# Patient Record
Sex: Female | Born: 2000 | Hispanic: Yes | Marital: Single | Smoking: Current some day smoker
Health system: Southern US, Community
[De-identification: ages and names within clinical notes are randomized; demographics above are authoritative.]

## PROBLEM LIST (undated history)

## (undated) DIAGNOSIS — L680 Hirsutism: Secondary | ICD-10-CM

## (undated) DIAGNOSIS — J302 Other seasonal allergic rhinitis: Secondary | ICD-10-CM

## (undated) DIAGNOSIS — N946 Dysmenorrhea, unspecified: Secondary | ICD-10-CM

## (undated) HISTORY — DX: Hirsutism: L68.0

---
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
## 2019-01-06 ENCOUNTER — Telehealth: Payer: Self-pay | Admitting: Pediatrics

## 2019-01-06 NOTE — Telephone Encounter (Signed)

## 2019-01-07 ENCOUNTER — Ambulatory Visit (INDEPENDENT_AMBULATORY_CARE_PROVIDER_SITE_OTHER): Payer: Medicaid Other | Admitting: Licensed Clinical Social Worker

## 2019-01-07 ENCOUNTER — Encounter: Payer: Self-pay | Admitting: Family

## 2019-01-07 ENCOUNTER — Other Ambulatory Visit: Payer: Self-pay

## 2019-01-07 ENCOUNTER — Ambulatory Visit (INDEPENDENT_AMBULATORY_CARE_PROVIDER_SITE_OTHER): Payer: Medicaid Other | Admitting: Family

## 2019-01-07 VITALS — BP 121/75 | HR 85 | Ht 62.4 in | Wt 152.6 lb

## 2019-01-07 DIAGNOSIS — F4323 Adjustment disorder with mixed anxiety and depressed mood: Secondary | ICD-10-CM | POA: Diagnosis not present

## 2019-01-07 DIAGNOSIS — N946 Dysmenorrhea, unspecified: Secondary | ICD-10-CM | POA: Diagnosis not present

## 2019-01-07 DIAGNOSIS — Z789 Other specified health status: Secondary | ICD-10-CM | POA: Diagnosis not present

## 2019-01-07 DIAGNOSIS — Z30012 Encounter for prescription of emergency contraception: Secondary | ICD-10-CM

## 2019-01-07 DIAGNOSIS — N920 Excessive and frequent menstruation with regular cycle: Secondary | ICD-10-CM | POA: Diagnosis not present

## 2019-01-07 DIAGNOSIS — Z3046 Encounter for surveillance of implantable subdermal contraceptive: Secondary | ICD-10-CM | POA: Diagnosis not present

## 2019-01-07 MED ORDER — NORTREL 0.5/35 (28) 0.5-35 MG-MCG PO TABS: 1.0000 | ORAL_TABLET | Freq: Every day | ORAL | 11 refills | Status: DC

## 2019-01-07 MED ORDER — FLUOXETINE HCL 40 MG PO CAPS: 40.0000 mg | ORAL_CAPSULE | Freq: Every day | ORAL | 0 refills | Status: DC

## 2019-01-07 MED ORDER — ULIPRISTAL ACETATE 30 MG PO TABS: 1.0000 | ORAL_TABLET | Freq: Once | ORAL | 0 refills | Status: AC

## 2019-01-07 NOTE — BH Specialist Note (Signed)
Integrated Behavioral Health via Telemedicine Video Visit  01/07/2019 Vernella Jerilynn Mages Caesar [name] [id number]  Number of [place] visits: 7th ([facility] on 5/13) Session Start time: 2:30P  Session End time: 3:01 PM Total time: 31 minutes  Referring Provider: Dr. Rae Lips Type of Visit: Video Patient/Family location: Home [facility] Provider location: Remote home office All persons participating in visit: Patient and [facility]  Confirmed patient's address: Yes  Confirmed patient's phone number: Yes  Any changes to demographics: No   Confirmed patient's insurance: Yes  Any changes to patient's insurance: No   Discussed confidentiality: Yes   I connected with Nicole [name] and/or Nicole [name]'s patient by a video enabled telemedicine application and verified that I am speaking with the correct person using two identifiers.     I discussed the limitations of evaluation and management by telemedicine and the availability of in person appointments.  I discussed that the purpose of this visit is to provide behavioral health care while limiting exposure to the novel coronavirus.   Discussed there is a possibility of technology failure and discussed alternative modes of communication if that failure occurs.  I discussed that engaging in this video visit, they consent to the provision of behavioral healthcare and the services will be billed under their insurance.  Patient and/or legal guardian expressed understanding and consented to video visit: Yes   PRESENTING CONCERNS: Patient and/or family reports the following symptoms/concerns: depression/anxiety Duration of problem: Years; Severity of problem: moderate  STRENGTHS (Protective Factors/Coping Skills): Open  GOALS ADDRESSED: Patient will: 1.  Reduce symptoms of: anxiety and depression  2.  Increase knowledge and/or ability of: coping skills and healthy habits  3.  Demonstrate ability to: Increase healthy  adjustment to current life circumstances  INTERVENTIONS: Interventions utilized:  Solution-Focused Strategies, Mindfulness or Psychologist, educational, Behavioral Activation, Supportive Counseling and Psychoeducation and/or Health Education Standardized Assessments completed: Not Needed  ASSESSMENT: Patient currently experiencing moved in to her new apartment last weekend.   Patient may benefit from making a schedule, is looking at registration currently.   Feeling tired, went on a trip this weekend to the mountains.   "I want to get my life more together." Sleep, organization, routine.    PLAN: 1. Follow up with behavioral health clinician on : PRN 2. Behavioral recommendations: See above 3. Referral(s): [place] (In Clinic)  I discussed the assessment and treatment plan with the patient and/or parent/guardian. They were provided an opportunity to ask questions and all were answered. They agreed with the plan and demonstrated an understanding of the instructions.   They were advised to call back or seek an in-person evaluation if the symptoms worsen or if the condition fails to improve as anticipated.  Nicole [name]

## 2019-01-08 ENCOUNTER — Ambulatory Visit (INDEPENDENT_AMBULATORY_CARE_PROVIDER_SITE_OTHER): Payer: Medicaid Other | Admitting: Family

## 2019-01-08 VITALS — BP 116/79 | HR 70

## 2019-01-08 DIAGNOSIS — Z30012 Encounter for prescription of emergency contraception: Secondary | ICD-10-CM | POA: Diagnosis not present

## 2019-01-08 MED ORDER — LEVONORGESTREL 1.5 MG PO TABS
1.5000 mg | ORAL_TABLET | Freq: Once | ORAL | Status: AC
  Administered 2019-01-08: 1.5 mg via ORAL

## 2019-01-09 ENCOUNTER — Encounter: Payer: Self-pay | Admitting: Family

## 2019-01-09 NOTE — Progress Notes (Addendum)
History was provided by the patient.  Nicole [name] is a 18 y.o. female who is here for nexplanon removal.   PCP confirmed? Yes.    Kalman JewelsMcQueen, Shannon, MD  HPI:   -desires nexplanon removal, had it since 04/2018. -[place] this [place]  -no bleeding or cycle on implant -not interested in pg, has BF, one female partner  -she doesn't want an implant again, nor an IUD -she is interested in OCPs, no contraindication for estrogen - specifically no migraine with aura, no known liver disease, no cancers, no DVT/PE.   Review of Systems  Constitutional: Negative for chills, fever and malaise/fatigue.  HENT: Negative for sore throat.   Respiratory: Negative for cough and shortness of breath.   Cardiovascular: Negative for chest pain and palpitations.  Gastrointestinal: Negative for abdominal pain, heartburn and nausea.  Genitourinary: Negative for dysuria and frequency.  Musculoskeletal: Negative for myalgias.  Skin: Negative for rash.  Neurological: Negative for dizziness and headaches.  Psychiatric/Behavioral: Negative for depression.     Patient Active Problem List   Diagnosis Date Noted  . Encounter for surveillance of Nexplanon subdermal contraceptive 06/18/2018  . Dysphonia 12/10/2017  . Globus sensation 12/10/2017  . Adjustment disorder with mixed anxiety and depressed mood 05/15/2017  . Voice strain 02/19/2017  . Acne vulgaris 09/07/2014  . Allergic rhinitis 01/31/2013    Current Outpatient Medications on File Prior to Visit  Medication Sig Dispense Refill  . clindamycin-benzoyl peroxide (BENZACLIN) gel Apply topically 2 (two) times daily. 25 g 3  . ibuprofen (ADVIL) 800 MG tablet Take 1 tablet (800 mg total) by mouth every 8 (eight) hours as needed. 30 tablet 0  . montelukast (SINGULAIR) 10 MG tablet TAKE 1 TABLET BY MOUTH EVERYDAY AT BEDTIME 90 tablet 1  . Olopatadine HCl (PAZEO) 0.7 % SOLN Place 1 drop into both eyes daily. 2.5 mL 3   No current  facility-administered medications on file prior to visit.     No Known Allergies  Physical Exam:    Vitals:   01/07/19 0856  BP: 121/75  Pulse: 85  Weight: 152 lb 9.6 oz (69.2 kg)  Height: 5' 2.4" (1.585 m)    Blood pressure percentiles are not available for patients who are 18 years or older. No LMP recorded.  Physical Exam Vitals signs reviewed.  Constitutional:      Appearance: She is not ill-appearing.  HENT:     Head: Normocephalic.  Eyes:     Extraocular Movements: Extraocular movements intact.     Pupils: Pupils are equal, round, and reactive to light.     Comments: Wearing corrective lenses   Neck:     Musculoskeletal: Normal range of motion.  Cardiovascular:     Rate and Rhythm: Normal rate and regular rhythm.     Heart sounds: No murmur.  Musculoskeletal: Normal range of motion.  Lymphadenopathy:     Cervical: No cervical adenopathy.  Skin:    General: Skin is warm and dry.     Findings: No rash.     Comments: Implant palpable in LUE -correct position prior to removal  Neurological:     General: No focal deficit present.     Mental Status: She is alert.  Psychiatric:        Attention and Perception: Attention normal.        Mood and Affect: Mood normal.     Assessment/Plan: 1. Menorrhagia with regular cycle  -we reviewed tier 1 and tier 2 birth  control options, including iud, implant, depo, pill, patch, ring  -she elects to return to OCPs -will follow up in 8 weeks via video to discuss use of OCPs -reviewed EC and condom use - norethindrone-ethinyl estradiol (NORTREL 0.5/35, 28,) 0.5-35 MG-MCG tablet; Take 1 tablet by mouth daily.  Dispense: 28 tablet; Refill: 11  2. Dysmenorrhea -as above; should see control of cramping with OCP use -reviewed how to use OCP pack  - norethindrone-ethinyl estradiol (NORTREL 0.5/35, 28,) 0.5-35 MG-MCG tablet; Take 1 tablet by mouth daily.  Dispense: 28 tablet; Refill: 11  3. Encounter for Nexplanon removal Risks &  benefits of Nexplanon removal discussed. Consent form signed.  The patient denies any allergies to anesthetics or antiseptics.  Procedure: Pt was placed in supine position. left arm was flexed at the elbow and externally rotated so that her wrist was parallel to her ear, The device was palpated and marked. The site was cleaned with Betadine. The area surrounding the device was covered with a sterile drape. 1% lidocaine was injected just under the device. A scalpel was used to create a small incision. The device was pushed towards the incision. Fibrous tissue surrounding the device was gradually removed from the device. The device was removed and measured to ensure all 4 cm of device was removed. Steri-strips were used to close the incision. Pressure dressing was applied to the patient.  The patient was instructed to removed the pressure dressing in 24 hrs.  The patient was advised to move slowly from a supine to an upright position  The patient denied any concerns or complaints  The patient was instructed to schedule a follow-up appt in 1 month. The patient will be called in 1 week to address any concerns.  4. Adjustment disorder with mixed anxiety and depressed mood -continue fluoxetine 40 mg, refill given  -she is safe to herself, continue therapy  - FLUoxetine (PROZAC) 40 MG capsule; Take 1 capsule (40 mg total) by mouth daily.  Dispense: 90 capsule; Refill: 0  5. Emergency contraception -reviewed that if needed as back-up consider having on-hand - ulipristal acetate (ELLA) 30 MG tablet; Take 1 tablet (30 mg total) by mouth once for 1 dose.  Dispense: 1 tablet; Refill: 0

## 2019-01-09 NOTE — Progress Notes (Signed)
History was provided by the patient.  Nicole [name] is a 18 y.o. female who is here for emergency contraception.   PCP confirmed? Yes.    Rae Lips, MD  HPI:   -patient had nexplanon removed yesterday  -had concern and anxiety over possible pregnancy since her last unprotected sex was Sunday  -insurance does not cover the prescription sent.   -she is not feeling well, nauseous, dizzy left work early; wearing mask    Patient Active Problem List   Diagnosis Date Noted  . Encounter for surveillance of Nexplanon subdermal contraceptive 06/18/2018  . Dysphonia 12/10/2017  . Globus sensation 12/10/2017  . Adjustment disorder with mixed anxiety and depressed mood 05/15/2017  . Voice strain 02/19/2017  . Acne vulgaris 09/07/2014  . Allergic rhinitis 01/31/2013    Current Outpatient Medications on File Prior to Visit  Medication Sig Dispense Refill  . clindamycin-benzoyl peroxide (BENZACLIN) gel Apply topically 2 (two) times daily. 25 g 3  . FLUoxetine (PROZAC) 40 MG capsule Take 1 capsule (40 mg total) by mouth daily. 90 capsule 0  . ibuprofen (ADVIL) 800 MG tablet Take 1 tablet (800 mg total) by mouth every 8 (eight) hours as needed. 30 tablet 0  . montelukast (SINGULAIR) 10 MG tablet TAKE 1 TABLET BY MOUTH EVERYDAY AT BEDTIME 90 tablet 1  . norethindrone-ethinyl estradiol (NORTREL 0.5/35, 28,) 0.5-35 MG-MCG tablet Take 1 tablet by mouth daily. 28 tablet 11  . Olopatadine HCl (PAZEO) 0.7 % SOLN Place 1 drop into both eyes daily. 2.5 mL 3   No current facility-administered medications on file prior to visit.     No Known Allergies     Vitals:   01/08/19 0956  BP: 116/79  Pulse: 70    Assessment/Plan:  1. Encounter for prescription of emergency contraception  - levonorgestrel (PLAN B 1-STEP) tablet 1.5 mg  EC given to patient by Otho Ket, [place]  Patient was advised to wait 5 days to begin OCP pack  Return precautions given

## 2019-01-11 ENCOUNTER — Encounter: Payer: Self-pay | Admitting: Family

## 2019-01-31 ENCOUNTER — Ambulatory Visit: Payer: Medicaid Other | Admitting: Licensed Clinical Social Worker

## 2019-01-31 DIAGNOSIS — F4323 Adjustment disorder with mixed anxiety and depressed mood: Secondary | ICD-10-CM

## 2019-01-31 NOTE — BH Specialist Note (Signed)
Integrated Behavioral Health via Telemedicine Video Visit  01/31/2019 Joreen Jerilynn Mages Caesar Chestnut [id number]  Number of [place] visits: 8 Session Start time: 11:15A  Session End time: 11:25A Total time: 10 minutes   No charge for this visit due to brief length of time.   Referring Provider: Dr. Rae Lips Type of Visit: Video Patient/Family location: Home [facility] Provider location: Remote home office All persons participating in visit: Patient and [facility]  Confirmed patient's address: Yes  Confirmed patient's phone number: Yes  Any changes to demographics: No   Confirmed patient's insurance: Yes  Any changes to patient's insurance: No   Discussed confidentiality: Yes   I connected with Ivannia M Caesar Chestnut and/or Nautica Laury Deep Carrera's patient by a video enabled telemedicine application and verified that I am speaking with the correct person using two identifiers.     I discussed the limitations of evaluation and management by telemedicine and the availability of in person appointments.  I discussed that the purpose of this visit is to provide behavioral health care while limiting exposure to the novel coronavirus.   Discussed there is a possibility of technology failure and discussed alternative modes of communication if that failure occurs.  I discussed that engaging in this video visit, they consent to the provision of behavioral healthcare and the services will be billed under their insurance.  Patient and/or legal guardian expressed understanding and consented to video visit: Yes   PRESENTING CONCERNS: Patient and/or family reports the following symptoms/concerns: stress Duration of problem: Ongoing; Severity of problem: mild  STRENGTHS (Protective Factors/Coping Skills): Smart, open  GOALS ADDRESSED: Patient will: 1.  Reduce symptoms of: stress  2.  Increase knowledge and/or ability of: healthy habits  3.  Demonstrate ability to: Increase healthy  adjustment to current life circumstances and Increase adequate support systems for patient/family  INTERVENTIONS: Interventions utilized:  Solution-Focused Strategies and Supportive Counseling Standardized Assessments completed: Not Needed  ASSESSMENT: Patient currently experiencing stress related to school and life circumstances.   Patient may benefit from OPT or in-house at [facility] therapy for support.  PLAN: 1. Follow up with behavioral health clinician on : PRN 2. Behavioral recommendations: Patient asked for an email with resources so she can reach out PRN 3. Referral(s): [facility] (LME/Outside Clinic)  I discussed the assessment and treatment plan with the patient and/or parent/guardian. They were provided an opportunity to ask questions and all were answered. They agreed with the plan and demonstrated an understanding of the instructions.   They were advised to call back or seek an in-person evaluation if the symptoms worsen or if the condition fails to improve as anticipated.  Marinda Elk

## 2019-03-12 ENCOUNTER — Ambulatory Visit: Payer: Medicaid Other | Admitting: Family

## 2019-03-21 DIAGNOSIS — F419 Anxiety disorder, unspecified: Secondary | ICD-10-CM | POA: Diagnosis not present

## 2019-04-04 DIAGNOSIS — F419 Anxiety disorder, unspecified: Secondary | ICD-10-CM | POA: Diagnosis not present

## 2019-04-05 ENCOUNTER — Other Ambulatory Visit: Payer: Self-pay | Admitting: Pediatrics

## 2019-04-05 DIAGNOSIS — N946 Dysmenorrhea, unspecified: Secondary | ICD-10-CM

## 2019-04-11 ENCOUNTER — Other Ambulatory Visit: Payer: Self-pay

## 2019-04-11 DIAGNOSIS — Z20822 Contact with and (suspected) exposure to covid-19: Secondary | ICD-10-CM

## 2019-04-11 DIAGNOSIS — Z20828 Contact with and (suspected) exposure to other viral communicable diseases: Secondary | ICD-10-CM | POA: Diagnosis not present

## 2019-04-13 LAB — NOVEL CORONAVIRUS, NAA: SARS-CoV-2, NAA: NOT DETECTED

## 2019-04-22 ENCOUNTER — Other Ambulatory Visit: Payer: Self-pay | Admitting: Family

## 2019-04-22 DIAGNOSIS — F419 Anxiety disorder, unspecified: Secondary | ICD-10-CM | POA: Diagnosis not present

## 2019-04-22 DIAGNOSIS — F4323 Adjustment disorder with mixed anxiety and depressed mood: Secondary | ICD-10-CM

## 2019-04-25 DIAGNOSIS — F419 Anxiety disorder, unspecified: Secondary | ICD-10-CM | POA: Diagnosis not present

## 2019-05-02 DIAGNOSIS — F419 Anxiety disorder, unspecified: Secondary | ICD-10-CM | POA: Diagnosis not present

## 2019-05-07 ENCOUNTER — Other Ambulatory Visit: Payer: Self-pay

## 2019-05-07 DIAGNOSIS — Z20828 Contact with and (suspected) exposure to other viral communicable diseases: Secondary | ICD-10-CM | POA: Diagnosis not present

## 2019-05-07 DIAGNOSIS — Z20822 Contact with and (suspected) exposure to covid-19: Secondary | ICD-10-CM

## 2019-05-08 LAB — NOVEL CORONAVIRUS, NAA: SARS-CoV-2, NAA: NOT DETECTED

## 2019-05-27 DIAGNOSIS — F419 Anxiety disorder, unspecified: Secondary | ICD-10-CM | POA: Diagnosis not present

## 2019-06-02 ENCOUNTER — Other Ambulatory Visit: Payer: Self-pay

## 2019-06-02 ENCOUNTER — Ambulatory Visit: Payer: Medicaid Other | Attending: Internal Medicine

## 2019-06-02 DIAGNOSIS — Z20822 Contact with and (suspected) exposure to covid-19: Secondary | ICD-10-CM

## 2019-06-02 DIAGNOSIS — Z20828 Contact with and (suspected) exposure to other viral communicable diseases: Secondary | ICD-10-CM | POA: Diagnosis not present

## 2019-06-03 LAB — NOVEL CORONAVIRUS, NAA: SARS-CoV-2, NAA: NOT DETECTED

## 2019-06-17 DIAGNOSIS — F419 Anxiety disorder, unspecified: Secondary | ICD-10-CM | POA: Diagnosis not present

## 2019-06-26 DIAGNOSIS — F419 Anxiety disorder, unspecified: Secondary | ICD-10-CM | POA: Diagnosis not present

## 2019-07-01 ENCOUNTER — Telehealth: Payer: Medicaid Other

## 2019-07-01 ENCOUNTER — Other Ambulatory Visit: Payer: Self-pay

## 2019-07-01 ENCOUNTER — Encounter: Payer: Self-pay | Admitting: Family

## 2019-07-01 ENCOUNTER — Ambulatory Visit (INDEPENDENT_AMBULATORY_CARE_PROVIDER_SITE_OTHER): Payer: Medicaid Other | Admitting: *Deleted

## 2019-07-01 ENCOUNTER — Other Ambulatory Visit: Payer: Self-pay | Admitting: Pediatrics

## 2019-07-01 DIAGNOSIS — N926 Irregular menstruation, unspecified: Secondary | ICD-10-CM

## 2019-07-01 LAB — HCG, QUANTITATIVE, PREGNANCY: HCG, Total, QN: <3 m[IU]/mL

## 2019-07-01 NOTE — Progress Notes (Signed)
Patient came in for labs beta hcg. Labs ordered by Alfonso Ramus. Successful collection.

## 2019-07-03 ENCOUNTER — Encounter: Payer: Self-pay | Admitting: Pediatrics

## 2019-07-03 ENCOUNTER — Telehealth: Payer: Medicaid Other | Admitting: Pediatrics

## 2019-07-03 ENCOUNTER — Other Ambulatory Visit: Payer: Self-pay

## 2019-07-03 NOTE — Progress Notes (Signed)
No show. Opened in error

## 2019-07-04 ENCOUNTER — Other Ambulatory Visit: Payer: Self-pay | Admitting: Family

## 2019-07-04 DIAGNOSIS — N946 Dysmenorrhea, unspecified: Secondary | ICD-10-CM

## 2019-07-05 DIAGNOSIS — Z20828 Contact with and (suspected) exposure to other viral communicable diseases: Secondary | ICD-10-CM | POA: Diagnosis not present

## 2019-07-07 ENCOUNTER — Other Ambulatory Visit: Payer: Self-pay | Admitting: Allergy and Immunology

## 2019-07-08 ENCOUNTER — Other Ambulatory Visit: Payer: Medicaid Other

## 2019-07-24 DIAGNOSIS — F419 Anxiety disorder, unspecified: Secondary | ICD-10-CM | POA: Diagnosis not present

## 2019-08-07 DIAGNOSIS — F419 Anxiety disorder, unspecified: Secondary | ICD-10-CM | POA: Diagnosis not present

## 2019-08-12 DIAGNOSIS — F419 Anxiety disorder, unspecified: Secondary | ICD-10-CM | POA: Diagnosis not present

## 2019-08-14 DIAGNOSIS — H52223 Regular astigmatism, bilateral: Secondary | ICD-10-CM | POA: Diagnosis not present

## 2019-08-14 DIAGNOSIS — H538 Other visual disturbances: Secondary | ICD-10-CM | POA: Diagnosis not present

## 2019-08-14 DIAGNOSIS — H5213 Myopia, bilateral: Secondary | ICD-10-CM | POA: Diagnosis not present

## 2019-08-18 ENCOUNTER — Other Ambulatory Visit: Payer: Self-pay | Admitting: Family

## 2019-08-18 DIAGNOSIS — H5213 Myopia, bilateral: Secondary | ICD-10-CM | POA: Diagnosis not present

## 2019-08-18 DIAGNOSIS — F4323 Adjustment disorder with mixed anxiety and depressed mood: Secondary | ICD-10-CM

## 2019-08-21 DIAGNOSIS — F419 Anxiety disorder, unspecified: Secondary | ICD-10-CM | POA: Diagnosis not present

## 2019-08-25 ENCOUNTER — Telehealth: Payer: Self-pay | Admitting: Pediatrics

## 2019-08-25 NOTE — Telephone Encounter (Signed)
Pre-screening for onsite visit  1. Who is bringing the patient to the visit? Patient  Informed only one adult can bring patient to the visit to limit possible exposure to COVID19 and facemasks must be worn while in the building by the patient (ages 2 and older) and adult.  2. Has the person bringing the patient or the patient been around anyone with suspected or confirmed COVID-19 in the last 14 days? No   3. Has the person bringing the patient or the patient been around anyone who has been tested for COVID-19 in the last 14 days? No  4. Has the person bringing the patient or the patient had any No   Fever (temp 100 F or higher) Breathing problems Cough Sore throat Body aches Chills Vomiting Diarrhea Loss of taste or smell   If all answers are negative, advise patient to call our office prior to your appointment if you or the patient develop any of the symptoms listed above.   If any answers are yes, cancel in-office visit and schedule the patient for a same day telehealth visit with a provider to discuss the next steps.

## 2019-08-26 ENCOUNTER — Other Ambulatory Visit: Payer: Self-pay

## 2019-08-26 ENCOUNTER — Ambulatory Visit (INDEPENDENT_AMBULATORY_CARE_PROVIDER_SITE_OTHER): Payer: Medicaid Other | Admitting: Pediatrics

## 2019-08-26 VITALS — BP 135/80 | HR 85 | Temp 97.3°F | Ht 62.6 in | Wt 170.6 lb

## 2019-08-26 DIAGNOSIS — N898 Other specified noninflammatory disorders of vagina: Secondary | ICD-10-CM

## 2019-08-26 DIAGNOSIS — Z1389 Encounter for screening for other disorder: Secondary | ICD-10-CM | POA: Diagnosis not present

## 2019-08-26 DIAGNOSIS — Z720 Tobacco use: Secondary | ICD-10-CM | POA: Diagnosis not present

## 2019-08-26 DIAGNOSIS — F4323 Adjustment disorder with mixed anxiety and depressed mood: Secondary | ICD-10-CM | POA: Diagnosis not present

## 2019-08-26 LAB — POCT URINE PREGNANCY: Preg Test, Ur: NEGATIVE

## 2019-08-26 LAB — POCT URINALYSIS DIPSTICK
Bilirubin, UA: NEGATIVE
Glucose, UA: NEGATIVE
Nitrite, UA: NEGATIVE
Protein, UA: NEGATIVE
Spec Grav, UA: 1.02 (ref 1.010–1.025)
Urobilinogen, UA: NEGATIVE E.U./dL — AB
pH, UA: 5 (ref 5.0–8.0)

## 2019-08-26 MED ORDER — FLUOXETINE HCL 20 MG PO CAPS: 20.0000 mg | ORAL_CAPSULE | Freq: Every day | ORAL | 0 refills | Status: DC

## 2019-08-26 NOTE — Progress Notes (Signed)
This note is not being shared with the patient for the following reason: To respect privacy (The patient or proxy has requested that the information not be shared).  THIS RECORD MAY CONTAIN CONFIDENTIAL INFORMATION THAT SHOULD NOT BE RELEASED WITHOUT REVIEW OF THE SERVICE PROVIDER.  Adolescent Medicine Consultation Follow-Up Visit Nicole [name]  is a 19 y.o. female referred by Kalman Jewels, MD here today for follow-up regarding adjustment disorder with mixed anxiety and depressed mood and vaginal discharge.    Plan at last adolescent specialty clinic visit included: 01/07/19: -nexplanon removed -started nortrel 0.5/35 01/08/2019: - plan B 07/01/19: Completed bHCG (negative) after missed menses 08/25/19: Sent message yesterday about concern re anxiety and dysuria  Pertinent Labs? Yes, UA with small leukocytes Growth Chart Viewed? yes   History was provided by the patient.  Interpreter? no  Chief complaint: dysuria and anxiety  HPI:   PCP Confirmed?  yes  My Chart Activated?   yes  Patient's personal or confidential phone number: [phone]  Needs a referral to gynecology- reviewed that GYN needs can be addressed in our clinic up to age 52.  Had a yeast infection last week- excessive discharge, pruritis, felt inflammed. Got OTC miconazole, which helped. White discharge, thick but not super thick. Has had yeast infections at least once a year.   Has burning when urine runs over the vulva and is concerned about an abrasion. Sometimes has minimal itching. First noted abrasion Wed or Thursday of last week. Thinks that abrasions may be related to sexual activity and certain positions. She does not use lubrication. She denies any true dysuria. Has some increase in urinary frequency, but no urgency.   Last sexually active early last week. Has only had one partner, is not very worried about STIs.   Would like to start fluoxetine again. Was on 40mg  as of last year. Would  often forget and then stopped taking it since she was not taking it consistent. Reports that mood has been "fine" but she is feeling more anxious regarding relationship or her life.  Has been seeing a therapist weekly since the fall. Attributes some anxiety to job search after quitting her job at , where she was miserable (did not like the people, masks not enforced).  Missed January period. Periods then returned to normal. She has been doing a reminder on her phone to remember to take OCPs. Attributed irregularity to not taking OCPs regularly. Does not use condoms and does not want any today.   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 Recently quit job and is looking for another job that fits her schedule.  No LMP recorded. No Known Allergies Current Outpatient Medications on File Prior to Visit  Medication Sig Dispense Refill  . clindamycin-benzoyl peroxide (BENZACLIN) gel Apply topically 2 (two) times daily. 25 g 3  . FLUoxetine (PROZAC) 40 MG capsule TAKE 1 CAPSULE BY MOUTH EVERY DAY 90 capsule 0  . ibuprofen (ADVIL) 800 MG tablet TAKE 1 TABLET BY MOUTH EVERY 8 HOURS AS NEEDED 30 tablet 0  . montelukast (SINGULAIR) 10 MG tablet TAKE 1 TABLET BY MOUTH EVERYDAY AT BEDTIME 30 tablet 0  . norethindrone-ethinyl estradiol (NORTREL 0.5/35, 28,) 0.5-35 MG-MCG tablet Take 1 tablet by mouth daily. 28 tablet 11  . Olopatadine HCl (PAZEO) 0.7 % SOLN Place 1 drop into both eyes daily. 2.5 mL 3  . [DISCONTINUED] ibuprofen (ADVIL) 800 MG tablet TAKE 1 TABLET BY MOUTH EVERY 8 HOURS AS NEEDED 30 tablet  0   No current facility-administered medications on file prior to visit.    Patient Active Problem List   Diagnosis Date Noted  . Encounter for surveillance of Nexplanon subdermal contraceptive 06/18/2018  . Dysphonia 12/10/2017  . Globus sensation 12/10/2017  . Adjustment disorder with mixed anxiety and depressed mood 05/15/2017  . Voice strain 02/19/2017  . Acne vulgaris  09/07/2014  . Allergic rhinitis 01/31/2013     Activities:  Special interests/hobbies/sports: enjoys going out to eat, hanging out with friends  Lifestyle habits that can impact QOL: Sleep: fine Eating habits/patterns:  Water intake: decent Body Movement: not discussed today  Confidentiality was discussed with the patient and if applicable, with caregiver as well.  Changes at home or school since last visit:  yes, quit job at [employer]  Tobacco?  yes, vaping- daily disposables; says that she knows she needs to stop, believes that she will be able to quit when she wants to and decides to try, not interested in nicotine replacement therapy at this time Drugs/ETOH?  no Partner preference?  female  Sexually Active?  yes; birth control pills; condoms offered but patient declined  Suicidal or homicidal thoughts?   no Self injurious behaviors?  no   The following portions of the patient's history were reviewed and updated as appropriate: allergies, current medications, past family history, past medical history, past social history, past surgical history and problem list.  Physical Exam:  Vitals:   08/26/19 1031  BP: 135/80  Pulse: 85  Temp: (!) 97.3 F (36.3 C)  Weight: 170 lb 9.6 oz (77.4 kg)  Height: 5' 2.6" (1.59 m)   BP 135/80   Pulse 85   Temp (!) 97.3 F (36.3 C)   Ht 5' 2.6" (1.59 m)   Wt 170 lb 9.6 oz (77.4 kg)   BMI 30.61 kg/m  Body mass index: body mass index is 30.61 kg/m. Blood pressure percentiles are not available for patients who are 18 years or older.  Physical Exam Exam conducted with a chaperone present.  Constitutional:      General: She is not in acute distress.    Appearance: Normal appearance. She is not toxic-appearing.  HENT:     Head: Normocephalic and atraumatic.     Right Ear: External ear normal.     Left Ear: External ear normal.     Nose: Nose normal. No congestion.     Mouth/Throat:     Mouth: Mucous membranes are moist.      Pharynx: Oropharynx is clear.  Eyes:     Extraocular Movements: Extraocular movements intact.     Conjunctiva/sclera: Conjunctivae normal.     Comments: Wears corrective lenses  Cardiovascular:     Rate and Rhythm: Normal rate and regular rhythm.     Pulses: Normal pulses.     Heart sounds: Normal heart sounds. No murmur. No friction rub. No gallop.   Pulmonary:     Effort: Pulmonary effort is normal.     Breath sounds: Normal breath sounds. No wheezing.  Abdominal:     General: Abdomen is flat. Bowel sounds are normal. There is no distension.     Palpations: Abdomen is soft.     Tenderness: There is no abdominal tenderness.  Genitourinary:    General: Normal vulva.     Pubic Area: No rash.      Tanner stage (genital): 5.     Labia:        Right: No lesion or injury.  Left: No lesion or injury.      Vagina: Vaginal discharge present. No erythema, tenderness or bleeding.     Cervix: No cervical motion tenderness or discharge.  Musculoskeletal:     Cervical back: Normal range of motion and neck supple.  Skin:    General: Skin is warm and dry.     Capillary Refill: Capillary refill takes less than 2 seconds.     Findings: No rash.  Neurological:     General: No focal deficit present.     Mental Status: She is alert and oriented to person, place, and time.  Psychiatric:        Mood and Affect: Mood normal.        Behavior: Behavior normal.        Thought Content: Thought content normal.        Judgment: Judgment normal.    PHQ-SADS Last 3 Score only 08/26/2019 10/15/2018 07/31/2017  PHQ-15 Score 2 12 -  Total GAD-7 Score 3 13 -  Score 3 13 2      Office Visit from 08/26/2019 in [facility]  PHQ-9 Total Score  3       Assessment/Plan: Zanita M [facility] is a 18yo AFAB-IAF who presents today for follow up of adjustment disorder with mixed anxiety and depressed mood, vaginal discharge, and pain with urination.  Regarding her pain with urination, this is most likely related to micro abrasions related to unlubricated sexual intercourse. Discussed use of lubrication and provided information with AVS. It does not seem consistent with urinary tract infection, thus antibiotics not prescribed for small leukocytes in urine, however culture was sent. Regarding her recent discharge, it does seem consistent with yeast infection, and she did have some thick white discharge on exam, however given absence of persistent symptoms, did not send diflucan today.    Regarding her adjustment disorder, she is currently experiencing heightened anxiety with life changes and transition to adulthood and would benefit from restarting her fluoxetine. Will plan to restart at a dose of 20mg  x2wk to ensure there are not over-stimulating adverse effects.   Lastly, she is vaping daily and acknowledges she does not wish to continue this long term. She is not ready to commit to a plan to quit today and she declined any nicotine replacement options, but will continue to readdress this in future visits.   BH screenings: PHQ-SADS reviewed and indicated no depression or anxiety, however given history and recent stressors, it is reasonable to resume fluoxetine. Screens discussed with patient and parent and adjustments to plan made accordingly.   1. Vaginal discharge - likely candidal infection, improved with OTC treatment - WET PREP BY MOLECULAR PROBE - C. trachomatis/N. gonorrhoeae RNA  2. Adjustment disorder with mixed anxiety and depressed mood - FLUoxetine (PROZAC) 20 MG capsule; Take 1 capsule (20 mg total) by mouth daily. Take 1 capsule daily for 2 weeks then increase to 40mg  (2 capsules) daily.  Dispense: 50 capsule; Refill: 0  3. Screening for genitourinary condition - POCT urinalysis dipstick - WET PREP BY MOLECULAR PROBE - C. trachomatis/N. gonorrhoeae RNA - Urine Culture - POCT urine pregnancy -counseled on lubricant use - no  need for treatment for UTI or candidal infection today  4. Current every day nicotine vaping -patient expressed desire to quit but feels that she will be able to do this independently when ready -continue to monitor   Follow-up:  6 weeks

## 2019-08-26 NOTE — Patient Instructions (Signed)
   Lube above or coconut oil if you are not using a condom will work

## 2019-08-27 NOTE — Progress Notes (Signed)
I have reviewed the resident's note and plan of care and helped develop the plan as necessary.  Catina Nuss, FNP   

## 2019-08-28 LAB — WET PREP BY MOLECULAR PROBE
Candida species: NOT DETECTED
Gardnerella vaginalis: NOT DETECTED
MICRO NUMBER:: 10261602
SPECIMEN QUALITY:: ADEQUATE
Trichomonas vaginosis: NOT DETECTED

## 2019-08-28 LAB — URINE CULTURE
MICRO NUMBER:: 10256624
SPECIMEN QUALITY:: ADEQUATE

## 2019-08-28 LAB — C. TRACHOMATIS/N. GONORRHOEAE RNA
C. trachomatis RNA, TMA: NOT DETECTED
N. gonorrhoeae RNA, TMA: NOT DETECTED

## 2019-08-29 ENCOUNTER — Other Ambulatory Visit: Payer: Self-pay | Admitting: Pediatrics

## 2019-08-29 MED ORDER — NITROFURANTOIN MONOHYD MACRO 100 MG PO CAPS: 100.0000 mg | ORAL_CAPSULE | Freq: Two times a day (BID) | ORAL | 0 refills | Status: AC

## 2019-09-01 ENCOUNTER — Other Ambulatory Visit: Payer: Self-pay | Admitting: Pediatrics

## 2019-09-05 DIAGNOSIS — F419 Anxiety disorder, unspecified: Secondary | ICD-10-CM | POA: Diagnosis not present

## 2019-09-11 DIAGNOSIS — H5213 Myopia, bilateral: Secondary | ICD-10-CM | POA: Diagnosis not present

## 2019-09-18 DIAGNOSIS — F419 Anxiety disorder, unspecified: Secondary | ICD-10-CM | POA: Diagnosis not present

## 2019-10-03 DIAGNOSIS — F419 Anxiety disorder, unspecified: Secondary | ICD-10-CM | POA: Diagnosis not present

## 2019-10-06 ENCOUNTER — Ambulatory Visit: Payer: Self-pay | Admitting: Pediatrics

## 2019-10-16 DIAGNOSIS — F419 Anxiety disorder, unspecified: Secondary | ICD-10-CM | POA: Diagnosis not present

## 2019-10-30 ENCOUNTER — Encounter: Payer: Self-pay | Admitting: Pediatrics

## 2019-11-14 DIAGNOSIS — F4322 Adjustment disorder with anxiety: Secondary | ICD-10-CM | POA: Diagnosis not present

## 2019-12-09 ENCOUNTER — Other Ambulatory Visit: Payer: Self-pay | Admitting: Allergy and Immunology

## 2020-01-18 ENCOUNTER — Other Ambulatory Visit: Payer: Self-pay | Admitting: Family

## 2020-01-18 DIAGNOSIS — N920 Excessive and frequent menstruation with regular cycle: Secondary | ICD-10-CM

## 2020-01-18 DIAGNOSIS — N946 Dysmenorrhea, unspecified: Secondary | ICD-10-CM

## 2020-02-18 DIAGNOSIS — J309 Allergic rhinitis, unspecified: Secondary | ICD-10-CM | POA: Diagnosis not present

## 2020-02-18 DIAGNOSIS — Z7689 Persons encountering health services in other specified circumstances: Secondary | ICD-10-CM | POA: Diagnosis not present

## 2020-02-18 DIAGNOSIS — Z3041 Encounter for surveillance of contraceptive pills: Secondary | ICD-10-CM | POA: Diagnosis not present

## 2020-06-22 DIAGNOSIS — Z1152 Encounter for screening for COVID-19: Secondary | ICD-10-CM | POA: Diagnosis not present

## 2020-07-01 DIAGNOSIS — Z23 Encounter for immunization: Secondary | ICD-10-CM | POA: Diagnosis not present

## 2020-07-01 DIAGNOSIS — K1370 Unspecified lesions of oral mucosa: Secondary | ICD-10-CM | POA: Diagnosis not present

## 2020-07-06 DIAGNOSIS — Z202 Contact with and (suspected) exposure to infections with a predominantly sexual mode of transmission: Secondary | ICD-10-CM | POA: Diagnosis not present

## 2020-07-06 DIAGNOSIS — H5213 Myopia, bilateral: Secondary | ICD-10-CM | POA: Diagnosis not present

## 2020-07-06 DIAGNOSIS — Z0001 Encounter for general adult medical examination with abnormal findings: Secondary | ICD-10-CM | POA: Diagnosis not present

## 2020-07-08 DIAGNOSIS — F4322 Adjustment disorder with anxiety: Secondary | ICD-10-CM | POA: Diagnosis not present

## 2020-07-15 DIAGNOSIS — F4322 Adjustment disorder with anxiety: Secondary | ICD-10-CM | POA: Diagnosis not present

## 2020-07-29 DIAGNOSIS — F4322 Adjustment disorder with anxiety: Secondary | ICD-10-CM | POA: Diagnosis not present

## 2020-07-30 DIAGNOSIS — H5213 Myopia, bilateral: Secondary | ICD-10-CM | POA: Diagnosis not present

## 2020-08-05 DIAGNOSIS — F4322 Adjustment disorder with anxiety: Secondary | ICD-10-CM | POA: Diagnosis not present

## 2020-08-26 DIAGNOSIS — F4322 Adjustment disorder with anxiety: Secondary | ICD-10-CM | POA: Diagnosis not present

## 2020-09-06 DIAGNOSIS — F4322 Adjustment disorder with anxiety: Secondary | ICD-10-CM | POA: Diagnosis not present

## 2020-09-09 DIAGNOSIS — F4322 Adjustment disorder with anxiety: Secondary | ICD-10-CM | POA: Diagnosis not present

## 2020-09-16 DIAGNOSIS — F4322 Adjustment disorder with anxiety: Secondary | ICD-10-CM | POA: Diagnosis not present

## 2020-09-23 DIAGNOSIS — F4322 Adjustment disorder with anxiety: Secondary | ICD-10-CM | POA: Diagnosis not present

## 2020-10-10 HISTORY — PX: WISDOM TOOTH EXTRACTION: SHX21

## 2020-10-18 DIAGNOSIS — F4322 Adjustment disorder with anxiety: Secondary | ICD-10-CM | POA: Diagnosis not present

## 2020-11-02 DIAGNOSIS — F4322 Adjustment disorder with anxiety: Secondary | ICD-10-CM | POA: Diagnosis not present

## 2020-11-10 DIAGNOSIS — F4322 Adjustment disorder with anxiety: Secondary | ICD-10-CM | POA: Diagnosis not present

## 2020-11-17 DIAGNOSIS — F4322 Adjustment disorder with anxiety: Secondary | ICD-10-CM | POA: Diagnosis not present

## 2020-12-01 DIAGNOSIS — F4322 Adjustment disorder with anxiety: Secondary | ICD-10-CM | POA: Diagnosis not present

## 2020-12-08 DIAGNOSIS — F4322 Adjustment disorder with anxiety: Secondary | ICD-10-CM | POA: Diagnosis not present

## 2020-12-22 DIAGNOSIS — F4322 Adjustment disorder with anxiety: Secondary | ICD-10-CM | POA: Diagnosis not present

## 2020-12-29 DIAGNOSIS — F4322 Adjustment disorder with anxiety: Secondary | ICD-10-CM | POA: Diagnosis not present

## 2021-01-07 DIAGNOSIS — F4322 Adjustment disorder with anxiety: Secondary | ICD-10-CM | POA: Diagnosis not present

## 2021-01-17 ENCOUNTER — Other Ambulatory Visit: Payer: Self-pay | Admitting: Family

## 2021-01-17 DIAGNOSIS — N946 Dysmenorrhea, unspecified: Secondary | ICD-10-CM

## 2021-01-17 DIAGNOSIS — N920 Excessive and frequent menstruation with regular cycle: Secondary | ICD-10-CM

## 2021-01-19 DIAGNOSIS — F4322 Adjustment disorder with anxiety: Secondary | ICD-10-CM | POA: Diagnosis not present

## 2021-01-24 DIAGNOSIS — F4322 Adjustment disorder with anxiety: Secondary | ICD-10-CM | POA: Diagnosis not present

## 2021-01-28 DIAGNOSIS — F4323 Adjustment disorder with mixed anxiety and depressed mood: Secondary | ICD-10-CM | POA: Diagnosis not present

## 2021-02-08 DIAGNOSIS — F4322 Adjustment disorder with anxiety: Secondary | ICD-10-CM | POA: Diagnosis not present

## 2021-03-07 DIAGNOSIS — F4322 Adjustment disorder with anxiety: Secondary | ICD-10-CM | POA: Diagnosis not present

## 2021-05-25 ENCOUNTER — Ambulatory Visit (INDEPENDENT_AMBULATORY_CARE_PROVIDER_SITE_OTHER): Payer: Medicaid Other | Admitting: Women's Health

## 2021-05-25 ENCOUNTER — Encounter: Payer: Self-pay | Admitting: Women's Health

## 2021-05-25 ENCOUNTER — Other Ambulatory Visit (HOSPITAL_COMMUNITY)
Admission: RE | Admit: 2021-05-25 | Discharge: 2021-05-25 | Disposition: A | Discharge status: Home / Self Care | Payer: Medicaid Other | Source: Ambulatory Visit | Attending: Women's Health | Admitting: Women's Health

## 2021-05-25 ENCOUNTER — Other Ambulatory Visit: Payer: Self-pay

## 2021-05-25 VITALS — BP 124/79 | HR 81 | Ht 63.0 in | Wt 169.2 lb

## 2021-05-25 DIAGNOSIS — Z01419 Encounter for gynecological examination (general) (routine) without abnormal findings: Secondary | ICD-10-CM | POA: Insufficient documentation

## 2021-05-25 DIAGNOSIS — Z202 Contact with and (suspected) exposure to infections with a predominantly sexual mode of transmission: Secondary | ICD-10-CM | POA: Diagnosis not present

## 2021-05-25 DIAGNOSIS — Z3009 Encounter for other general counseling and advice on contraception: Secondary | ICD-10-CM

## 2021-05-25 DIAGNOSIS — R0989 Other specified symptoms and signs involving the circulatory and respiratory systems: Secondary | ICD-10-CM | POA: Diagnosis not present

## 2021-05-25 MED ORDER — ETONOGESTREL-ETHINYL ESTRADIOL 0.12-0.015 MG/24HR VA RING: VAGINAL_RING | VAGINAL | 12 refills | Status: DC

## 2021-05-25 NOTE — Progress Notes (Signed)
GYNECOLOGY ANNUAL PREVENTATIVE CARE ENCOUNTER NOTE  History:     Nicole [name] is a 20 y.o. G0P0000 female here for a routine annual gynecologic exam.  Current complaints: none.   Denies abnormal vaginal bleeding, discharge, pelvic pain, problems with intercourse or other gynecologic concerns.  Patient wishes to discuss birth control today. Patient reports she has used Nexplanon in the past, but was not happy with the irregular bleeding that it brought. Patient is currently on the pill (Nortrel), but reports she is not great at remembering to take the pill, but has been doing better this past year. Patient reports she has 2 packs of pills left. 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  Pt requests STD testing today. Pt reports she does perform SBE. Pt denies bowel or bladder concerns. No family hx of breast, colon, endometrial or ovarian cancer. Pt does not drink or use drugs.     Gynecologic History Patient's last menstrual period was 05/14/2021 (exact date). Menstruation: once per month, 5-6 days, moderate bleeding, mild dysmenorrhea. Contraception: OCP (estrogen/progesterone). Pt reports she does not desire pregnancy in the next year. Last Pap: n/a age 62  Obstetric History OB History  Gravida Para Term Preterm AB Living  0 0 0 0 0 0  SAB IAB Ectopic Multiple Live Births  0 0 0 0 0    Past Medical History:  Diagnosis Date   Dysmenorrhea    Hirsutism 12/15/2012   Normal labs, PCOS unlikely, monitor symptoms over time.  OCPs started for menorrhagia and dysmenorrhea.    Seasonal allergies     Past Surgical History:  Procedure Laterality Date   WISDOM TOOTH EXTRACTION  10/2020    Current Outpatient Medications on File Prior to Visit  Medication Sig Dispense Refill   ibuprofen (ADVIL) 800  MG tablet TAKE 1 TABLET BY MOUTH EVERY 8 HOURS AS NEEDED 30 tablet 0   valACYclovir (VALTREX) 500 MG tablet Taken one tablet BID for 3 days per outbreak     No current facility-administered medications on file prior to visit.    No Known Allergies  Social History:  reports that she has never smoked. She has never used smokeless tobacco. She reports current alcohol use. She reports that she does not use drugs.  Family History  Problem Relation Age of Onset   Diabetes Maternal Grandmother    Hyperlipidemia Maternal Grandfather    Kidney cancer Maternal Grandfather    Diabetes Other    Allergic rhinitis Neg Hx    Angioedema Neg Hx    Asthma Neg Hx    Eczema Neg Hx    Urticaria Neg Hx     The following portions of the patient's history were reviewed and updated as appropriate: allergies, current medications, past family history, past medical history, past social history, past surgical history and problem list.  Review of Systems Pertinent items noted in HPI and remainder of comprehensive ROS otherwise negative.  Physical Exam:  BP 124/79    Pulse 81    Ht 5\' 3"  (1.6 m)    Wt 169 lb 3.2 oz (76.7 kg)    LMP 05/14/2021 (Exact Date)    BMI 29.97 kg/m   CONSTITUTIONAL: Well-developed, well-nourished female in no acute distress.  HENT:  Normocephalic, atraumatic, External right and left ear normal. EYES: Conjunctivae and EOM are normal. Pupils are equal, round,  and reactive to light. No scleral icterus.  NECK: Normal range of motion, supple, no masses.  Normal thyroid.  SKIN: Skin is warm and dry. No rash noted. Not diaphoretic. No erythema. No pallor. MUSCULOSKELETAL: Normal range of motion. No tenderness.  No cyanosis, clubbing, or edema. NEUROLOGIC: Alert and oriented to person, place, and time. Normal reflexes, muscle tone coordination. PSYCHIATRIC: Normal mood and affect. Normal behavior. Normal judgment and thought content. CARDIOVASCULAR: Normal heart rate noted, regular  rhythm. RESPIRATORY: Abnormal lung sounds on auscultation, diminished sounds in right lower lobe, otherwise normal. Effort normal, no problems with respiration noted. BREASTS: Symmetric in size. No masses, skin changes, nipple drainage, or lymphadenopathy. ABDOMEN: Soft, normal bowel sounds, no distention noted.  No tenderness, rebound or guarding.  PELVIC: Normal appearing external genitalia; normal appearing vaginal mucosa and cervix.  No abnormal discharge noted.  Pap smear obtained.  Normal uterine size, no other palpable masses, no uterine or adnexal tenderness.   Assessment and Plan:     1. Exposure to sexually transmitted disease (STD) - Cervicovaginal ancillary only( [place]) - Hepatitis B surface antigen - Hepatitis C antibody - HIV Antibody (routine testing w rflx) - RPR  2. Well woman exam - Cytology - PAP( [place]) - RX NuvaRing - discussed risks of smoking with estrogen-containing birth control  Will follow up results of pap smear and other testing, if performed, and manage accordingly. Routine preventative health maintenance measures emphasized. Self-breast awareness taught, importance discussed, advised when to RTC, SBA literature given. Please refer to After Visit Summary for other counseling recommendations.      Gearldine Shown, [facility] Nurse Practitioner, [facility] for [employer], [facility]

## 2021-05-25 NOTE — Progress Notes (Signed)
Patient presents as a New Patient AEX. Patient desires to have STD testing. She does complain of having some vaginal irritation.

## 2021-05-25 NOTE — Patient Instructions (Addendum)
When using your birth control, if you experience any of the following, please call the office or report to the nearest emergency room immediately: -severe abdominal pain/weakness -chest pain/shortness of breath -the worst HA you have ever had in your life -sudden changes in vision -difficulty speaking -severe leg pain/redness/swelling Please also refer to the additional information you were given in the office today while using your birth control.

## 2021-05-26 LAB — HEPATITIS B SURFACE ANTIGEN: Hepatitis B Surface Ag: NEGATIVE

## 2021-05-26 LAB — CERVICOVAGINAL ANCILLARY ONLY
Chlamydia: NEGATIVE
Comment: NEGATIVE
Comment: NEGATIVE
Comment: NORMAL
Neisseria Gonorrhea: NEGATIVE
Trichomonas: NEGATIVE

## 2021-05-26 LAB — HIV ANTIBODY (ROUTINE TESTING W REFLEX): HIV Screen 4th Generation wRfx: NONREACTIVE

## 2021-05-26 LAB — RPR: RPR Ser Ql: NONREACTIVE

## 2021-05-26 LAB — HEPATITIS C ANTIBODY: Hep C Virus Ab: <0.1 s/co ratio (ref 0.0–0.9)

## 2021-05-27 ENCOUNTER — Ambulatory Visit: Payer: Medicaid Other | Attending: Nurse Practitioner | Admitting: Nurse Practitioner

## 2021-05-27 ENCOUNTER — Encounter: Payer: Self-pay | Admitting: Nurse Practitioner

## 2021-05-27 ENCOUNTER — Other Ambulatory Visit: Payer: Self-pay

## 2021-05-27 VITALS — BP 114/78 | HR 74 | Ht 63.0 in | Wt 169.1 lb

## 2021-05-27 DIAGNOSIS — Z7689 Persons encountering health services in other specified circumstances: Secondary | ICD-10-CM

## 2021-05-27 DIAGNOSIS — Z23 Encounter for immunization: Secondary | ICD-10-CM | POA: Diagnosis not present

## 2021-05-27 NOTE — Progress Notes (Signed)
Assessment & Plan:  Nicole [name] was seen today for establish care.  Diagnoses and all orders for this visit:  Encounter to establish care  Need for influenza vaccination -     Flu Vaccine QUAD 53mo+IM (Fluarix, Fluzone & Alfiuria Quad PF)  Need for Tdap vaccination -     Tdap vaccine greater than or equal to 21yo IM   Patient has been counseled on age-appropriate routine health concerns for screening and prevention. These are reviewed and up-to-date. Referrals have been placed accordingly. Immunizations are up-to-date or declined.    Subjective:   Chief Complaint  Patient presents with   Establish Care   HPI Nicole [name] 20 y.o. female presents to office today to establish care.  She has a history of dysmenorrhea, HSV 1  She takes ibuprofen for painful periods. She is using the nuvaring for dysmenorrhea as well. She has been on OCPs in the past and had the Nexplanon but states neither worked for her.     Review of Systems  Constitutional:  Negative for fever, malaise/fatigue and weight loss.  HENT: Negative.  Negative for nosebleeds.   Eyes: Negative.  Negative for blurred vision, double vision and photophobia.  Respiratory: Negative.  Negative for cough and shortness of breath.   Cardiovascular: Negative.  Negative for chest pain, palpitations and leg swelling.  Gastrointestinal: Negative.  Negative for heartburn, nausea and vomiting.  Musculoskeletal: Negative.  Negative for myalgias.  Neurological: Negative.  Negative for dizziness, focal weakness, seizures and headaches.  Psychiatric/Behavioral: Negative.  Negative for suicidal ideas.    Past Medical History:  Diagnosis Date   Dysmenorrhea    Hirsutism 12/15/2012   Normal labs, PCOS unlikely, monitor symptoms over time.  OCPs started for menorrhagia and dysmenorrhea.    Seasonal allergies     Past Surgical History:  Procedure Laterality Date   WISDOM TOOTH EXTRACTION  10/2020    Family History  Problem  Relation Age of Onset   Diabetes Maternal Grandmother    Hyperlipidemia Maternal Grandfather    Kidney cancer Maternal Grandfather    Diabetes Other    Allergic rhinitis Neg Hx    Angioedema Neg Hx    Asthma Neg Hx    Eczema Neg Hx    Urticaria Neg Hx     Social History Reviewed with no changes to be made today.   Outpatient Medications Prior to Visit  Medication Sig Dispense Refill   etonogestrel-ethinyl estradiol (NUVARING) 0.12-0.015 MG/24HR vaginal ring Insert vaginally and leave in place for 3 consecutive weeks, then remove for 1 week. 1 each 12   ibuprofen (ADVIL) 800 MG tablet TAKE 1 TABLET BY MOUTH EVERY 8 HOURS AS NEEDED 30 tablet 0   valACYclovir (VALTREX) 500 MG tablet Taken one tablet BID for 3 days per outbreak     No facility-administered medications prior to visit.    No Known Allergies     Objective:    BP 114/78    Pulse 74    Ht 5\' 3"  (1.6 m)    Wt 169 lb 2 oz (76.7 kg)    LMP 05/14/2021 (Exact Date)    SpO2 95%    BMI 29.96 kg/m  Wt Readings from Last 3 Encounters:  05/27/21 169 lb 2 oz (76.7 kg)  05/25/21 169 lb 3.2 oz (76.7 kg)  08/26/19 170 lb 9.6 oz (77.4 kg) (92 %, Z= 1.39)*   * Growth percentiles are based on CDC (Girls, 2-20 Years) data.    Physical Exam Vitals  and nursing note reviewed.  Constitutional:      Appearance: She is well-developed.  HENT:     Head: Normocephalic and atraumatic.  Cardiovascular:     Rate and Rhythm: Normal rate and regular rhythm.     Heart sounds: Normal heart sounds. No murmur heard.   No friction rub. No gallop.  Pulmonary:     Effort: Pulmonary effort is normal. No tachypnea or respiratory distress.     Breath sounds: Normal breath sounds. No decreased breath sounds, wheezing, rhonchi or rales.  Chest:     Chest wall: No tenderness.  Abdominal:     General: Bowel sounds are normal.     Palpations: Abdomen is soft.  Musculoskeletal:        General: Normal range of motion.     Cervical back: Normal range  of motion.  Skin:    General: Skin is warm and dry.  Neurological:     Mental Status: She is alert and oriented to person, place, and time.     Coordination: Coordination normal.  Psychiatric:        Behavior: Behavior normal. Behavior is cooperative.        Thought Content: Thought content normal.        Judgment: Judgment normal.         Patient has been counseled extensively about nutrition and exercise as well as the importance of adherence with medications and regular follow-up. The patient was given clear instructions to go to ER or return to medical center if symptoms don't improve, worsen or new problems develop. The patient verbalized understanding.   Follow-up: Return if symptoms worsen or fail to improve.   Claiborne Rigg, FNP-BC [facility] [place], [place] [phone]   05/27/2021, 9:20 AM

## 2021-06-01 LAB — CYTOLOGY - PAP: Diagnosis: NEGATIVE

## 2021-06-07 ENCOUNTER — Encounter (HOSPITAL_COMMUNITY): Payer: Self-pay | Admitting: Emergency Medicine

## 2021-06-07 ENCOUNTER — Ambulatory Visit (HOSPITAL_COMMUNITY)
Admission: EM | Admit: 2021-06-07 | Discharge: 2021-06-07 | Disposition: A | Discharge status: Home / Self Care | Payer: Medicaid Other | Attending: Physician Assistant | Admitting: Physician Assistant

## 2021-06-07 ENCOUNTER — Ambulatory Visit: Payer: Self-pay | Admitting: *Deleted

## 2021-06-07 ENCOUNTER — Other Ambulatory Visit: Payer: Self-pay

## 2021-06-07 DIAGNOSIS — L089 Local infection of the skin and subcutaneous tissue, unspecified: Secondary | ICD-10-CM | POA: Diagnosis not present

## 2021-06-07 MED ORDER — DOXYCYCLINE HYCLATE 100 MG PO CAPS: 100.0000 mg | ORAL_CAPSULE | Freq: Two times a day (BID) | ORAL | 0 refills | Status: DC

## 2021-06-07 NOTE — ED Triage Notes (Signed)
Pt had some discomfort on side of umbilicus area for a few days.  Noticed some discharge and bleeding last night.

## 2021-06-07 NOTE — Telephone Encounter (Signed)
Pt. Calling in.   She is having bleeding from her navel and a brown crust like discharge too.   I'm cleaning it with alcohol.   I'm having pain around my belly button to the right of it for a day or two.   The bleeding started last night.   No piercing.   It's red coming from inside of it irritation.     She sees Economist at [facility] but there are no appts available.   I have referred her to the [facility] which she is agreeable to going.  No protocol found for this complaint.

## 2021-06-07 NOTE — ED Provider Notes (Signed)
[facility]    CSN: [id number] Arrival date & time: 06/07/21  1404      History   Chief Complaint No chief complaint on file.   HPI Nicole [name] is a 20 y.o. female.   Patient here today for evaluation of bleeding and discharge from her belly button that started a few days ago. She has minimal tenderness around belly button but no other pain. She reports she has been using alcohol to clean. She has not had fever.   The history is provided by the patient.   Past Medical History:  Diagnosis Date   Dysmenorrhea    Hirsutism 12/15/2012   Normal labs, PCOS unlikely, monitor symptoms over time.  OCPs started for menorrhagia and dysmenorrhea.    Seasonal allergies     Patient Active Problem List   Diagnosis Date Noted   Dysphonia 12/10/2017   Globus sensation 12/10/2017   Adjustment disorder with mixed anxiety and depressed mood 05/15/2017   Voice strain 02/19/2017   Acne vulgaris 09/07/2014   Allergic rhinitis 01/31/2013    Past Surgical History:  Procedure Laterality Date   WISDOM TOOTH EXTRACTION  10/2020    OB History     Gravida  0   Para  0   Term  0   Preterm  0   AB  0   Living  0      SAB  0   IAB  0   Ectopic  0   Multiple  0   Live Births  0            Home Medications    Prior to Admission medications   Medication Sig Start Date End Date Taking? Authorizing Provider  doxycycline (VIBRAMYCIN) 100 MG capsule Take 1 capsule (100 mg total) by mouth 2 (two) times daily. 06/07/21  Yes Tomi Bamberger, PA-C  etonogestrel-ethinyl estradiol (NUVARING) 0.12-0.015 MG/24HR vaginal ring Insert vaginally and leave in place for 3 consecutive weeks, then remove for 1 week. 05/25/21   Nugent, Odie Sera, NP  ibuprofen (ADVIL) 800 MG tablet TAKE 1 TABLET BY MOUTH EVERY 8 HOURS AS NEEDED 07/04/19   Verneda Skill, FNP  valACYclovir (VALTREX) 500 MG tablet Taken one tablet BID for 3 days per outbreak 04/19/21   [provider]    Family History Family History  Problem Relation Age of Onset   Diabetes Maternal Grandmother    Hyperlipidemia Maternal Grandfather    Kidney cancer Maternal Grandfather    Diabetes Other    Allergic rhinitis Neg Hx    Angioedema Neg Hx    Asthma Neg Hx    Eczema Neg Hx    Urticaria Neg Hx     Social History Social History   Tobacco Use   Smoking status: Never   Smokeless tobacco: Never  Vaping Use   Vaping Use: Every day  Substance Use Topics   Alcohol use: Yes    Comment: occ   Drug use: Never     Allergies   Patient has no known allergies.   Review of Systems Review of Systems  Constitutional:  Negative for chills and fever.  Eyes:  Negative for discharge and redness.  Respiratory:  Negative for shortness of breath.   Skin:  Negative for color change.    Physical Exam Triage Vital Signs ED Triage Vitals  Enc Vitals Group     BP 06/07/21 1537 100/72     Pulse Rate 06/07/21 1537 66  Resp 06/07/21 1537 17     Temp 06/07/21 1537 98.6 F (37 C)     Temp Source 06/07/21 1537 Oral     SpO2 06/07/21 1537 97 %     Weight --      Height --      Head Circumference --      Peak Flow --      Pain Score 06/07/21 1535 1     Pain Loc --      Pain Edu? --      Excl. in [place]? --    No data found.  Updated Vital Signs BP 100/72 (BP Location: Left Arm)    Pulse 66    Temp 98.6 F (37 C) (Oral)    Resp 17    LMP 05/14/2021 (Exact Date)    SpO2 97%   Physical Exam Vitals and nursing note reviewed.  Constitutional:      General: She is not in acute distress.    Appearance: Normal appearance. She is not ill-appearing.  HENT:     Head: Normocephalic and atraumatic.  Eyes:     Conjunctiva/sclera: Conjunctivae normal.  Cardiovascular:     Rate and Rhythm: Normal rate.  Pulmonary:     Effort: Pulmonary effort is normal.  Skin:    Comments: Minimal bleeding noted to umbilicus. Discharge noted to pants from umbilicus.   Neurological:      Mental Status: She is alert.  Psychiatric:        Mood and Affect: Mood normal.        Behavior: Behavior normal.     UC Treatments / Results  Labs (all labs ordered are listed, but only abnormal results are displayed) Labs Reviewed - No data to display  EKG   Radiology No results found.  Procedures Procedures (including critical care time)  Medications Ordered in UC Medications - No data to display  Initial Impression / Assessment and Plan / UC Course  I have reviewed the triage vital signs and the nursing notes.  Pertinent labs & imaging results that were available during my care of the patient were reviewed by me and considered in my medical decision making (see chart for details).    Doxycycline prescribed to cover bacterial cause of symptoms. Recommended she continue to keep area clean. Encouraged follow up if no gradual improvement or if symptoms worsen in any way.   Final Clinical Impressions(s) / UC Diagnoses   Final diagnoses:  Skin infection   Discharge Instructions   None    ED Prescriptions     Medication Sig Dispense Auth. Provider   doxycycline (VIBRAMYCIN) 100 MG capsule Take 1 capsule (100 mg total) by mouth 2 (two) times daily. 20 capsule Francene Finders, PA-C      PDMP not reviewed this encounter.   Francene Finders, PA-C 06/07/21 1553

## 2021-06-14 DIAGNOSIS — R0989 Other specified symptoms and signs involving the circulatory and respiratory systems: Secondary | ICD-10-CM | POA: Insufficient documentation

## 2021-06-15 ENCOUNTER — Other Ambulatory Visit: Payer: Self-pay

## 2021-06-15 ENCOUNTER — Ambulatory Visit (HOSPITAL_COMMUNITY)
Admission: EM | Admit: 2021-06-15 | Discharge: 2021-06-15 | Disposition: A | Discharge status: Home / Self Care | Payer: Medicaid Other | Attending: Emergency Medicine | Admitting: Emergency Medicine

## 2021-06-15 DIAGNOSIS — A630 Anogenital (venereal) warts: Secondary | ICD-10-CM | POA: Diagnosis not present

## 2021-06-15 DIAGNOSIS — N898 Other specified noninflammatory disorders of vagina: Secondary | ICD-10-CM | POA: Insufficient documentation

## 2021-06-15 MED ORDER — IMIQUIMOD 3.75 % EX CREA
TOPICAL_CREAM | CUTANEOUS | 0 refills | Status: AC
Start: 2021-06-15 — End: ?

## 2021-06-15 MED ORDER — FLUCONAZOLE 150 MG PO TABS
150.0000 mg | ORAL_TABLET | Freq: Every day | ORAL | 0 refills | Status: AC
Start: 2021-06-15 — End: 2021-06-17

## 2021-06-15 NOTE — ED Provider Notes (Signed)
Nicole [name]    CSN: [id number] Arrival date & time: 06/15/21  0810      History   Chief Complaint Chief Complaint  Patient presents with   Foreign Body in Vagina    Pt reports having two flaps on her vaginal x 2 days. She does report vaginal discharge.    HPI Nicole [name] is a 21 y.o. female.   Patient presents with thin Nicole [name] discharge, vaginal itching remarkable for days.  Endorses new skin fold for 2 days.  Lesion is not painful, swollen, no discharge noted.  Has not attempted treatment of symptoms.  Sexually active, partner, sometimes condom use.  Currently on antibiotic course for umbilical infection.  Denies abdominal pain or pressure, flank pain, urinary frequency, urgency, hematuria, dysuria, vaginal odor.    Past Medical History:  Diagnosis Date   Dysmenorrhea    Hirsutism 12/15/2012   Normal labs, PCOS unlikely, monitor symptoms over time.  OCPs started for menorrhagia and dysmenorrhea.    Seasonal allergies     Patient Active Problem List   Diagnosis Date Noted   Abnormal lung sounds 06/14/2021   Dysphonia 12/10/2017   Globus sensation 12/10/2017   Adjustment disorder with mixed anxiety and depressed mood 05/15/2017   Voice strain 02/19/2017   Acne vulgaris 09/07/2014   Allergic rhinitis 01/31/2013    Past Surgical History:  Procedure Laterality Date   WISDOM TOOTH EXTRACTION  10/2020    OB History     Gravida  0   Para  0   Term  0   Preterm  0   AB  0   Living  0      SAB  0   IAB  0   Ectopic  0   Multiple  0   Live Births  0            Home Medications    Prior to Admission medications   Medication Sig Start Date End Date Taking? Authorizing Provider  doxycycline (VIBRAMYCIN) 100 MG capsule Take 1 capsule (100 mg total) by mouth 2 (two) times daily. 06/07/21   Francene Finders, PA-C  etonogestrel-ethinyl estradiol (NUVARING) 0.12-0.015 MG/24HR vaginal ring Insert vaginally and leave in place for 3  consecutive weeks, then remove for 1 week. 05/25/21   Nugent, Gerrie Nordmann, NP  ibuprofen (ADVIL) 800 MG tablet TAKE 1 TABLET BY MOUTH EVERY 8 HOURS AS NEEDED 07/04/19   Trude Mcburney, FNP  valACYclovir (VALTREX) 500 MG tablet Taken one tablet BID for 3 days per outbreak 04/19/21   [provider]    Family History Family History  Problem Relation Age of Onset   Diabetes Maternal Grandmother    Hyperlipidemia Maternal Grandfather    Kidney cancer Maternal Grandfather    Diabetes Other    Allergic rhinitis Neg Hx    Angioedema Neg Hx    Asthma Neg Hx    Eczema Neg Hx    Urticaria Neg Hx     Social History Social History   Tobacco Use   Smoking status: Never   Smokeless tobacco: Never  Vaping Use   Vaping Use: Every day  Substance Use Topics   Alcohol use: Yes    Comment: occ   Drug use: Never     Allergies   Patient has no known allergies.   Review of Systems Review of Systems  Constitutional: Negative.   Respiratory: Negative.    Genitourinary:  Positive for genital sores and vaginal discharge. Negative for  decreased urine volume, difficulty urinating, dyspareunia, dysuria, enuresis, flank pain, frequency, hematuria, menstrual problem, pelvic pain, urgency, vaginal bleeding and vaginal pain.  Skin: Negative.   Neurological: Negative.     Physical Exam Triage Vital Signs ED Triage Vitals  Enc Vitals Group     BP 06/15/21 0846 (!) 130/91     Pulse Rate 06/15/21 0846 86     Resp 06/15/21 0846 16     Temp 06/15/21 0846 99.5 F (37.5 C)     Temp Source 06/15/21 0846 Oral     SpO2 06/15/21 0846 100 %     Weight --      Height --      Head Circumference --      Peak Flow --      Pain Score 06/15/21 0847 0     Pain Loc --      Pain Edu? --      Excl. in [place]? --    No data found.  Updated Vital Signs BP (!) 130/91 (BP Location: Left Arm)    Pulse 86    Temp 99.5 F (37.5 C) (Oral)    Resp 16    LMP 05/12/2021 (Approximate)    SpO2 100%   Visual  Acuity Right Eye Distance:   Left Eye Distance:   Bilateral Distance:    Right Eye Near:   Left Eye Near:    Bilateral Near:     Physical Exam Constitutional:      Appearance: Normal appearance. She is normal weight.  Eyes:     Extraocular Movements: Extraocular movements intact.  Pulmonary:     Effort: Pulmonary effort is normal.  Genitourinary:      Comments: Anogenital wart present, thin Nicole [name] discharge present in vaginal canal and over labia minora, no tenderness or swelling noted  Neurological:     Mental Status: She is alert and oriented to person, place, and time. Mental status is at baseline.  Psychiatric:        Mood and Affect: Mood normal.        Behavior: Behavior normal.     UC Treatments / Results  Labs (all labs ordered are listed, but only abnormal results are displayed) Labs Reviewed - No data to display  EKG   Radiology No results found.  Procedures Procedures (including critical care time)  Medications Ordered in UC Medications - No data to display  Initial Impression / Assessment and Plan / UC Course  I have reviewed the triage vital signs and the nursing notes.  Pertinent labs & imaging results that were available during my care of the patient were reviewed by me and considered in my medical decision making (see chart for details).  Genital warts Vaginal discharge  We will treat prophylactically for yeast infection due to current antibiotic use, prescribed Diflucan, STI screening pending, will treat per protocol, advised abstinence until lab results, and/or treatment is complete, advised condom use during all sexual encounters for, prescribed imiquimod cream for treatment of genital wart, given walking referral to gynecology for follow-up Final Clinical Impressions(s) / UC Diagnoses   Final diagnoses:  None   Discharge Instructions   None    ED Prescriptions   None    PDMP not reviewed this encounter.   [name],  [place] 06/15/21 [phone]

## 2021-06-15 NOTE — Discharge Instructions (Addendum)
Take first Diflucan pill today, then take second dose after completion of antibiotic course  Follow directions on cream for treatment of lesions, vaginal packet is more information on genital warts, listed below is a local gynecologist office that you may follow-up with if symptoms recur  Labs pending 2-3 days, you will be contacted if positive for any sti and treatment will be sent to the pharmacy, you will have to return to the clinic if positive for gonorrhea to receive treatment   Please refrain from having sex until labs results, if positive please refrain from having sex until treatment complete and symptoms resolve   If positive for Chlamydia  gonorrhea or trichomoniasis please notify partner or partners so they may tested as well  Moving forward, it is recommended you use some form of protection against the transmission of sti infections  such as condoms or dental dams with each sexual encounter

## 2021-06-16 ENCOUNTER — Telehealth: Payer: Self-pay | Admitting: Nurse Practitioner

## 2021-06-16 LAB — CERVICOVAGINAL ANCILLARY ONLY
Bacterial Vaginitis (gardnerella): NEGATIVE
Candida Glabrata: NEGATIVE
Candida Vaginitis: POSITIVE — AB
Chlamydia: NEGATIVE
Comment: NEGATIVE
Comment: NEGATIVE
Comment: NEGATIVE
Comment: NEGATIVE
Comment: NEGATIVE
Comment: NORMAL
Neisseria Gonorrhea: NEGATIVE
Trichomonas: NEGATIVE

## 2021-06-16 NOTE — Telephone Encounter (Signed)
Patient called in says cream prescribed by ER, Imiquimod 3.75 % CREAM isnt covered under her insurance and  it cost, $1000 she cant afford, she is asking for alternatives. Please call  back

## 2021-06-17 ENCOUNTER — Ambulatory Visit: Payer: Self-pay | Admitting: *Deleted

## 2021-06-17 NOTE — Telephone Encounter (Signed)
Patient called, left VM to return the call to the office to speak to a nurse. She will need to be advised the message below from the UC provider she saw on 06/15/21.    [name], NP     AW Please advise patient to go to the local health department for treatment of genital warts, they should be able to provide you medication free of charge. Thank you

## 2021-06-17 NOTE — Telephone Encounter (Signed)
Pt. Called back and given message to contact health department. Verbalizes understanding and given phone number.

## 2021-06-17 NOTE — Telephone Encounter (Signed)
Patient called, left VM to return the call to the office to speak to a nurse. 

## 2021-06-17 NOTE — Telephone Encounter (Signed)
Pt called in however she hung up or the line disconnected just as she was being transferred to me from the agent.   I attempted to call her back however got her voicemail.  I left a message to call us back.

## 2021-06-19 DIAGNOSIS — Z03818 Encounter for observation for suspected exposure to other biological agents ruled out: Secondary | ICD-10-CM | POA: Diagnosis not present

## 2021-06-19 DIAGNOSIS — Z20822 Contact with and (suspected) exposure to covid-19: Secondary | ICD-10-CM | POA: Diagnosis not present

## 2021-06-22 DIAGNOSIS — Z113 Encounter for screening for infections with a predominantly sexual mode of transmission: Secondary | ICD-10-CM | POA: Diagnosis not present

## 2021-06-22 DIAGNOSIS — A630 Anogenital (venereal) warts: Secondary | ICD-10-CM | POA: Diagnosis not present

## 2021-06-22 DIAGNOSIS — Z114 Encounter for screening for human immunodeficiency virus [HIV]: Secondary | ICD-10-CM | POA: Diagnosis not present

## 2021-06-22 DIAGNOSIS — Z3200 Encounter for pregnancy test, result unknown: Secondary | ICD-10-CM | POA: Diagnosis not present

## 2021-09-22 ENCOUNTER — Ambulatory Visit: Payer: Medicaid Other | Attending: Physician Assistant | Admitting: Physician Assistant

## 2021-09-22 ENCOUNTER — Encounter: Payer: Self-pay | Admitting: Physician Assistant

## 2021-09-22 ENCOUNTER — Other Ambulatory Visit (HOSPITAL_COMMUNITY)
Admission: RE | Admit: 2021-09-22 | Discharge: 2021-09-22 | Disposition: A | Discharge status: Home / Self Care | Payer: Medicaid Other | Source: Ambulatory Visit | Attending: Physician Assistant | Admitting: Physician Assistant

## 2021-09-22 VITALS — BP 110/76 | HR 75 | Wt 160.6 lb

## 2021-09-22 DIAGNOSIS — N898 Other specified noninflammatory disorders of vagina: Secondary | ICD-10-CM | POA: Insufficient documentation

## 2021-09-22 NOTE — Progress Notes (Signed)
Patient ID: MERCEDEZ [name], female   DOB: [birth year], 21 y.o.   MRN: [id number] ? ? ?[name], is a 21 y.o. female ? ?[id number] ? ?[id number] ? ?DOB - March 21, 2001 ? ?Chief Complaint  ?Patient presents with  ? Vaginal Itching  ?    ? ?Subjective:  ? ?Nicole [name] is a 21 y.o. female here today for recheck genital warts- ?Patient went to ED in January and was prescribed imiquimod for genital warts.  She could not afford this so she went to the health dept and they got it for her.  She used it a few weeks then stopped bc she didn't feel like it was going away.  Now she is having itching.  No vaginal discharge.  No pelvic pain.   ? ?ALLERGIES: ?No Known Allergies ? ?PAST MEDICAL HISTORY: ?Past Medical History:  ?Diagnosis Date  ? Dysmenorrhea   ? Hirsutism 12/15/2012  ? Normal labs, PCOS unlikely, monitor symptoms over time.  OCPs started for menorrhagia and dysmenorrhea.   ? Seasonal allergies   ? ? ?MEDICATIONS AT HOME: ?Prior to Admission medications   ?Medication Sig Start Date End Date Taking? Authorizing Provider  ?doxycycline (VIBRAMYCIN) 100 MG capsule Take 1 capsule (100 mg total) by mouth 2 (two) times daily. 06/07/21   Tomi Bamberger, PA-C  ?etonogestrel-ethinyl estradiol (NUVARING) 0.12-0.015 MG/24HR vaginal ring Insert vaginally and leave in place for 3 consecutive weeks, then remove for 1 week. 05/25/21   Nugent, Odie Sera, NP  ?ibuprofen (ADVIL) 800 MG tablet TAKE 1 TABLET BY MOUTH EVERY 8 HOURS AS NEEDED 07/04/19   Verneda Skill, FNP  ?Imiquimod 3.75 % CREA Apply a thin layer once daily (using up to 1 packet or 1 full actuation of pump) prior to bedtime; leave on skin for ~8 hours, then remove with mild soap and water. Continue treatment until there is total clearance of the warts or for a maximum duration of therapy of 8 weeks 06/15/21   Valinda Hoar, NP  ?valACYclovir (VALTREX) 500 MG tablet Taken one tablet BID for 3 days per outbreak 04/19/21   [provider]  ? ? ?ROS: ?Neg HEENT ?Neg resp ?Neg cardiac ?Neg MS ?Neg psych ?Neg neuro ? ?Objective:  ? ?Vitals:  ? 09/22/21 1345  ?BP: 110/76  ?Pulse: 75  ?SpO2: 99%  ?Weight: 160 lb 9.6 oz (72.8 kg)  ? ?Exam ?General appearance : Awake, alert, not in any distress. Speech Clear. Not toxic looking ?HEENT: Atraumatic and Normocephalic, pupils equally reactive to light and accomodation ?Neck: Supple, no JVD. No cervical lymphadenopathy.  ?Chest: Good air entry bilaterally, CTAB.  No rales/rhonchi/wheezing ?CVS: S1 S2 regular, no murmurs.  ?GU: L introitus there is a resolving are of wart that appears denuded and is tender.  Speculum inserted.  Some whitish discharge.  Cervix without lesion.  Swab taken.   ?Extremities: B/L Lower Ext shows no edema, both legs are warm to touch ?Neurology: Awake alert, and oriented X 3, CN II-XII intact, Non focal ?Skin: No Rash ? ?Data Review ?Lab Results  ?Component Value Date  ? HGBA1C 5.2 01/13/2014  ? HGBA1C 5.5 04/22/2013  ? ? ?Assessment & Plan  ? ?1. Vaginal itching ?Resolving genital warts-she used it every day for a few weeks.  Use only m, w, f and follow up with [facility] in 6 weeks ?- Cervicovaginal ancillary only ? ? ? ?Patient have been counseled extensively about nutrition and exercise. Other issues discussed during this visit include: low cholesterol diet, weight  control and daily exercise, foot care, annual eye examinations at Ophthalmology, importance of adherence with medications and regular follow-up. We also discussed long term complications of uncontrolled diabetes and hypertension.  ? ?Return if symptoms worsen or fail to improve. ? ?The patient was given clear instructions to go to ER or return to medical center if symptoms don't improve, worsen or new problems develop. The patient verbalized understanding. The patient was told to call to get lab results if they haven't heard anything in the next week.  ? ? ? ? ?Georgian Co, PA-C ?[facility] ?[place], [place] ?[phone]   ?09/22/2021, 2:03 PM  ?

## 2021-09-23 LAB — CERVICOVAGINAL ANCILLARY ONLY
Bacterial Vaginitis (gardnerella): NEGATIVE
Candida Glabrata: NEGATIVE
Candida Vaginitis: POSITIVE — AB
Chlamydia: NEGATIVE
Comment: NEGATIVE
Comment: NEGATIVE
Comment: NEGATIVE
Comment: NEGATIVE
Comment: NEGATIVE
Comment: NORMAL
Neisseria Gonorrhea: NEGATIVE
Trichomonas: NEGATIVE

## 2021-09-24 ENCOUNTER — Other Ambulatory Visit: Payer: Self-pay | Admitting: Physician Assistant

## 2021-09-24 MED ORDER — FLUCONAZOLE 150 MG PO TABS: 150.0000 mg | ORAL_TABLET | Freq: Once | ORAL | 0 refills | Status: AC

## 2021-10-28 DIAGNOSIS — Z113 Encounter for screening for infections with a predominantly sexual mode of transmission: Secondary | ICD-10-CM | POA: Diagnosis not present

## 2021-11-17 ENCOUNTER — Ambulatory Visit: Payer: Medicaid Other | Admitting: Advanced Practice Midwife

## 2021-11-17 VITALS — BP 110/74 | HR 63 | Ht 63.0 in | Wt 155.8 lb

## 2021-11-17 DIAGNOSIS — A630 Anogenital (venereal) warts: Secondary | ICD-10-CM

## 2021-11-17 NOTE — Progress Notes (Signed)
F/U HPV diagnosed at urgent care, followed up at [facility] and was given imiquimod and diagnosis confirmed. Thinks she still has warts. Wants to be checked.  Recent TX yeast.

## 2021-11-18 DIAGNOSIS — A630 Anogenital (venereal) warts: Secondary | ICD-10-CM | POA: Insufficient documentation

## 2021-11-18 NOTE — Progress Notes (Signed)
GYNECOLOGY ANNUAL PREVENTATIVE CARE ENCOUNTER NOTE  History:     Nicole [name] is a 21 y.o. G0P0000 female here for a routine annual gynecologic exam.  Current complaints: Patient diagnosed with HPV at Urgent Care and referred to [facility] for treatment. She initiated the prescribed treatment but requests reassurance "from my regular clinic" that treatment is warranted, no other treatment indicated, and no new lesions are observed.   Denies abnormal vaginal bleeding, discharge, pelvic pain, problems with intercourse or other gynecologic concerns.    Gynecologic History No LMP recorded (lmp unknown). Contraception: condoms (recent Plan B use for broken condom) Last Pap: 05/25/2021. Result was normal   Obstetric History OB History  Gravida Para Term Preterm AB Living  0 0 0 0 0 0  SAB IAB Ectopic Multiple Live Births  0 0 0 0 0    Past Medical History:  Diagnosis Date   Dysmenorrhea    Hirsutism 12/15/2012   Normal labs, PCOS unlikely, monitor symptoms over time.  OCPs started for menorrhagia and dysmenorrhea.    Seasonal allergies     Past Surgical History:  Procedure Laterality Date   WISDOM TOOTH EXTRACTION  10/2020    Current Outpatient Medications on File Prior to Visit  Medication Sig Dispense Refill   etonogestrel-ethinyl estradiol (NUVARING) 0.12-0.015 MG/24HR vaginal ring Insert vaginally and leave in place for 3 consecutive weeks, then remove for 1 week. 1 each 12   ibuprofen (ADVIL) 800 MG tablet TAKE 1 TABLET BY MOUTH EVERY 8 HOURS AS NEEDED 30 tablet 0   Imiquimod 3.75 % CREA Apply a thin layer once daily (using up to 1 packet or 1 full actuation of pump) prior to bedtime; leave on skin for ~8 hours, then remove with mild soap and water. Continue treatment until there is total clearance of the warts or for a maximum duration of therapy of 8 weeks 7.5 g 0   valACYclovir (VALTREX) 500 MG tablet Taken one tablet BID for 3 days per outbreak     doxycycline  (VIBRAMYCIN) 100 MG capsule Take 1 capsule (100 mg total) by mouth 2 (two) times daily. 20 capsule 0   No current facility-administered medications on file prior to visit.    No Known Allergies  Social History:  reports that she has never smoked. She has never used smokeless tobacco. She reports current alcohol use. She reports that she does not use drugs.  Family History  Problem Relation Age of Onset   Diabetes Maternal Grandmother    Hyperlipidemia Maternal Grandfather    Kidney cancer Maternal Grandfather    Diabetes Other    Allergic rhinitis Neg Hx    Angioedema Neg Hx    Asthma Neg Hx    Eczema Neg Hx    Urticaria Neg Hx     The following portions of the patient's history were reviewed and updated as appropriate: allergies, current medications, past family history, past medical history, past social history, past surgical history and problem list.  Review of Systems Pertinent items noted in HPI and remainder of comprehensive ROS otherwise negative.  Physical Exam:  BP 110/74   Pulse 63   Ht 5\' 3"  (1.6 m)   Wt 155 lb 12.8 oz (70.7 kg)   LMP  (LMP Unknown) Comment: LMP may, regular  BMI 27.60 kg/m  CONSTITUTIONAL: Well-developed, well-nourished female in no acute distress.  HENT:  Normocephalic, atraumatic, External right and left ear normal.  EYES: Conjunctivae and EOM are normal. Pupils are equal, round,  and reactive to light. No scleral icterus.  NECK: Normal range of motion, supple, no masses.  Normal thyroid.  SKIN: Skin is warm and dry. No rash noted. Not diaphoretic. No erythema. No pallor. NEUROLOGIC: Alert and oriented to person, place, and time.  PSYCHIATRIC: Normal mood and affect. Normal behavior. Normal judgment and thought content. CARDIOVASCULAR: Normal heart rate noted, regular rhythm RESPIRATORY: Effort normal, no problems with respiration noted. ABDOMEN: Soft, no distention noted.  No tenderness, rebound or guarding.  PELVIC: Normal appearing  external genitalia and urethral meatus; 3 healing HPV lesions at left groin. Crusted over, almost completely healed. Performed in the presence of a chaperone.   Assessment and Plan:    1. HPV (human papilloma virus) anogenital infection - Restart Imiquimod cream as prescribed by [facility] - RTC for lesions that do not clear with completion of currently prescribed treatment   Routine preventative health maintenance measures emphasized. Please refer to After Visit Summary for other counseling recommendations.      Clayton Bibles, MSA, MSN, CNM Certified Nurse Midwife, Biochemist, clinical for [employer], [facility]

## 2021-12-14 DIAGNOSIS — F4323 Adjustment disorder with mixed anxiety and depressed mood: Secondary | ICD-10-CM | POA: Diagnosis not present

## 2021-12-26 DIAGNOSIS — J029 Acute pharyngitis, unspecified: Secondary | ICD-10-CM | POA: Diagnosis not present

## 2021-12-26 DIAGNOSIS — Z6827 Body mass index (BMI) 27.0-27.9, adult: Secondary | ICD-10-CM | POA: Diagnosis not present

## 2021-12-27 DIAGNOSIS — F4323 Adjustment disorder with mixed anxiety and depressed mood: Secondary | ICD-10-CM | POA: Diagnosis not present

## 2022-01-01 DIAGNOSIS — J039 Acute tonsillitis, unspecified: Secondary | ICD-10-CM | POA: Diagnosis not present

## 2022-01-10 DIAGNOSIS — F4323 Adjustment disorder with mixed anxiety and depressed mood: Secondary | ICD-10-CM | POA: Diagnosis not present

## 2022-01-25 DIAGNOSIS — F4323 Adjustment disorder with mixed anxiety and depressed mood: Secondary | ICD-10-CM | POA: Diagnosis not present

## 2022-02-03 DIAGNOSIS — Z20822 Contact with and (suspected) exposure to covid-19: Secondary | ICD-10-CM | POA: Diagnosis not present

## 2022-02-03 DIAGNOSIS — Z6829 Body mass index (BMI) 29.0-29.9, adult: Secondary | ICD-10-CM | POA: Diagnosis not present

## 2022-02-03 DIAGNOSIS — Z111 Encounter for screening for respiratory tuberculosis: Secondary | ICD-10-CM | POA: Diagnosis not present

## 2022-02-03 DIAGNOSIS — Z0130 Encounter for examination of blood pressure without abnormal findings: Secondary | ICD-10-CM | POA: Diagnosis not present

## 2022-02-08 DIAGNOSIS — F4323 Adjustment disorder with mixed anxiety and depressed mood: Secondary | ICD-10-CM | POA: Diagnosis not present

## 2022-02-13 DIAGNOSIS — Z3200 Encounter for pregnancy test, result unknown: Secondary | ICD-10-CM | POA: Diagnosis not present

## 2022-02-13 DIAGNOSIS — N898 Other specified noninflammatory disorders of vagina: Secondary | ICD-10-CM | POA: Diagnosis not present

## 2022-02-13 DIAGNOSIS — R300 Dysuria: Secondary | ICD-10-CM | POA: Diagnosis not present

## 2022-02-13 DIAGNOSIS — Z7251 High risk heterosexual behavior: Secondary | ICD-10-CM | POA: Diagnosis not present

## 2022-02-13 DIAGNOSIS — Z3009 Encounter for other general counseling and advice on contraception: Secondary | ICD-10-CM | POA: Diagnosis not present

## 2022-02-13 DIAGNOSIS — B3731 Acute candidiasis of vulva and vagina: Secondary | ICD-10-CM | POA: Diagnosis not present

## 2022-02-22 DIAGNOSIS — F4323 Adjustment disorder with mixed anxiety and depressed mood: Secondary | ICD-10-CM | POA: Diagnosis not present

## 2022-02-27 ENCOUNTER — Encounter: Payer: Medicaid Other | Admitting: Nurse Practitioner

## 2022-03-03 ENCOUNTER — Encounter: Payer: Medicaid Other | Admitting: Nurse Practitioner

## 2022-03-08 DIAGNOSIS — F4323 Adjustment disorder with mixed anxiety and depressed mood: Secondary | ICD-10-CM | POA: Diagnosis not present

## 2022-03-22 DIAGNOSIS — F4323 Adjustment disorder with mixed anxiety and depressed mood: Secondary | ICD-10-CM | POA: Diagnosis not present

## 2022-06-20 ENCOUNTER — Other Ambulatory Visit: Payer: Self-pay | Admitting: Family Medicine

## 2022-06-20 ENCOUNTER — Ambulatory Visit (INDEPENDENT_AMBULATORY_CARE_PROVIDER_SITE_OTHER): Payer: Medicaid Other | Admitting: Family Medicine

## 2022-06-20 ENCOUNTER — Other Ambulatory Visit (HOSPITAL_COMMUNITY)
Admission: RE | Admit: 2022-06-20 | Discharge: 2022-06-20 | Disposition: A | Discharge status: Home / Self Care | Payer: Medicaid Other | Source: Ambulatory Visit | Attending: Family Medicine | Admitting: Family Medicine

## 2022-06-20 ENCOUNTER — Encounter: Payer: Self-pay | Admitting: Family Medicine

## 2022-06-20 VITALS — BP 121/76 | HR 74 | Wt 170.2 lb

## 2022-06-20 DIAGNOSIS — Z8619 Personal history of other infectious and parasitic diseases: Secondary | ICD-10-CM | POA: Insufficient documentation

## 2022-06-20 DIAGNOSIS — L739 Follicular disorder, unspecified: Secondary | ICD-10-CM

## 2022-06-20 DIAGNOSIS — Z113 Encounter for screening for infections with a predominantly sexual mode of transmission: Secondary | ICD-10-CM | POA: Diagnosis not present

## 2022-06-20 DIAGNOSIS — R6882 Decreased libido: Secondary | ICD-10-CM

## 2022-06-20 DIAGNOSIS — R102 Pelvic and perineal pain: Secondary | ICD-10-CM | POA: Diagnosis present

## 2022-06-20 DIAGNOSIS — N941 Unspecified dyspareunia: Secondary | ICD-10-CM

## 2022-06-20 LAB — POCT URINALYSIS DIPSTICK
Bilirubin, UA: NEGATIVE
Blood, UA: NEGATIVE
Glucose, UA: NEGATIVE
Ketones, UA: NEGATIVE
Leukocytes, UA: NEGATIVE
Nitrite, UA: NEGATIVE
Odor: NEGATIVE
Protein, UA: NEGATIVE
Spec Grav, UA: 1.015 (ref 1.010–1.025)
Urobilinogen, UA: 0.2 E.U./dL
pH, UA: 7 (ref 5.0–8.0)

## 2022-06-20 NOTE — Progress Notes (Signed)
GYNECOLOGY OFFICE VISIT NOTE  History:   Nicole [name] is a 22 y.o. G0P0000 here today for dyspareunia for the last 5 months. Feels pain deep in the pelvis during intercourse. L>R side. She reports having chlamyidia infection about 5 months ago, was treated, but did have sex with partner before completing treatment, so worried about if it was cured. She has also had some yeast infections, treated with fluconazole PO she got from a family member. Currently has no abnormal discharge or no urinary urgency or dysuria. No other sexual partners in the last 6 months.   LMP 12/11, and has been regular. She uses nuvaring for birth control  She also reports new onset low libido, on for the last few months. Had increased stress at work as she was new there, but that is better now. Also had some stress with trust in her relationship. Not on any daily SSRIs.   Past Medical History:  Diagnosis Date   Dysmenorrhea    Hirsutism 12/15/2012   Normal labs, PCOS unlikely, monitor symptoms over time.  OCPs started for menorrhagia and dysmenorrhea.    Seasonal allergies     Past Surgical History:  Procedure Laterality Date   WISDOM TOOTH EXTRACTION  10/2020    The following portions of the patient's history were reviewed and updated as appropriate: allergies, current medications, past family history, past medical history, past social history, past surgical history and problem list.   Health Maintenance:  Normal pap on 05/25/2021.  Review of Systems:  Pertinent items noted in HPI and remainder of comprehensive ROS otherwise negative.  Physical Exam:  BP 121/76   Pulse 74   Wt 170 lb 3.2 oz (77.2 kg)   LMP 05/22/2022   BMI 30.15 kg/m  CONSTITUTIONAL: Well-developed, well-nourished female in no acute distress.  NECK: Normal range of motion, supple, no masses noted on observation SKIN: No rash noted. Not diaphoretic. No erythema. No pallor. MUSCULOSKELETAL: Normal range of motion. No edema  noted. PSYCHIATRIC: Normal mood and affect. Normal behavior. Normal judgment and thought content. CARDIOVASCULAR: Normal heart rate noted RESPIRATORY: Effort and breath sounds normal, no problems with respiration noted ABDOMEN: No masses noted. No other overt distention noted.   PELVIC: Noted to have a small papule,with mild surrounding erythema and induration on left labia majora. normal urethral meatus; normal appearing vaginal mucosa and cervix.  No abnormal discharge noted.  Normal uterine size, no other palpable masses, no uterine or adnexal tenderness. Performed in the presence of a chaperone  Labs and Imaging  No results found.    Assessment and Plan:  Pelvic pain, Dyspareunia Etiology unclear. DDX: vaginitis, anxiety. Less likely PID, as no acute vaginal discharge or malaise. - will get labs to r/o STIs or other course of vaginitis. - Consider pelvic US to evaluate for other anomalies like ovarian cysts. - Plan: POCT Urinalysis Dipstick, Cervicovaginal ancillary only( [place])  History of chlamydia infection  Routine screening for STI (sexually transmitted infection)  - Plan: Cervicovaginal ancillary only( [place]), Hepatitis B surface antigen, Hepatitis C antibody, RPR, HIV Antibody (routine testing w rflx)  - Plan: Hepatitis B surface antigen, Hepatitis C antibody, RPR, HIV Antibody (routine testing w rflx)  Perineal folliculitis - appears to be healing. Still has a substantial amount of induration. Ddx include abscess, hidradenitis, Lymph nodes, but these are less likely - Ok to monitor for now, expect to resolved over the next few weeks - follow up if worsening.  Low libido-  Likely multifactorial:  anxiety, relationship stress, work stress. May be related to dyspareunia. Encouraged to discuss anxiety with PCP   Routine preventative health maintenance measures emphasized. Please refer to After Visit Summary for other counseling recommendations.   No follow-ups on  file.    I spent  35  minutes dedicated to the care of this patient including pre-visit review of records, face to face time with the patient discussing her conditions and treatments and post visit orders.  Sheppard Evens MD MPH OB Fellow, Faculty Practice [facility], Center for [facility] 06/26/2022

## 2022-06-20 NOTE — Progress Notes (Signed)
Pt presents for pelvic discomfort, left side ingrown hair bump, low libido, and recurrent yeast infections.

## 2022-06-21 LAB — CERVICOVAGINAL ANCILLARY ONLY
Bacterial Vaginitis (gardnerella): NEGATIVE
Candida Glabrata: NEGATIVE
Candida Vaginitis: NEGATIVE
Chlamydia: NEGATIVE
Comment: NEGATIVE
Comment: NEGATIVE
Comment: NEGATIVE
Comment: NEGATIVE
Comment: NEGATIVE
Comment: NORMAL
Neisseria Gonorrhea: NEGATIVE
Trichomonas: NEGATIVE

## 2022-06-21 LAB — HEPATITIS B SURFACE ANTIGEN: Hepatitis B Surface Ag: NEGATIVE

## 2022-06-21 LAB — HEPATITIS C ANTIBODY: Hep C Virus Ab: NONREACTIVE

## 2022-06-21 LAB — RPR: RPR Ser Ql: NONREACTIVE

## 2022-06-21 LAB — HIV ANTIBODY (ROUTINE TESTING W REFLEX): HIV Screen 4th Generation wRfx: NONREACTIVE

## 2022-07-04 ENCOUNTER — Other Ambulatory Visit: Payer: Self-pay | Admitting: Emergency Medicine

## 2022-07-04 DIAGNOSIS — Z3009 Encounter for other general counseling and advice on contraception: Secondary | ICD-10-CM

## 2022-07-04 MED ORDER — ETONOGESTREL-ETHINYL ESTRADIOL 0.12-0.015 MG/24HR VA RING: VAGINAL_RING | VAGINAL | 12 refills | Status: DC

## 2022-07-04 NOTE — Progress Notes (Signed)
Refill for nuvaring.

## 2022-07-07 ENCOUNTER — Other Ambulatory Visit: Payer: Self-pay | Admitting: General Practice

## 2023-06-13 DIAGNOSIS — S42409A Unspecified fracture of lower end of unspecified humerus, initial encounter for closed fracture: Secondary | ICD-10-CM

## 2023-06-13 HISTORY — DX: Unspecified fracture of lower end of unspecified humerus, initial encounter for closed fracture: S42.409A

## 2023-07-05 ENCOUNTER — Encounter: Payer: Self-pay | Admitting: Emergency Medicine

## 2023-07-05 ENCOUNTER — Ambulatory Visit
Admission: EM | Admit: 2023-07-05 | Discharge: 2023-07-05 | Disposition: A | Discharge status: Home / Self Care | Payer: 59

## 2023-07-05 ENCOUNTER — Other Ambulatory Visit: Payer: Self-pay

## 2023-07-05 DIAGNOSIS — R051 Acute cough: Secondary | ICD-10-CM

## 2023-07-05 DIAGNOSIS — J0100 Acute maxillary sinusitis, unspecified: Secondary | ICD-10-CM

## 2023-07-05 MED ORDER — PROMETHAZINE-DM 6.25-15 MG/5ML PO SYRP
5.0000 mL | ORAL_SOLUTION | Freq: Every evening | ORAL | 0 refills | Status: DC | PRN
Start: 2023-07-05 — End: 2023-07-05

## 2023-07-05 MED ORDER — AMOXICILLIN-POT CLAVULANATE 875-125 MG PO TABS: 1.0000 | ORAL_TABLET | Freq: Two times a day (BID) | ORAL | 0 refills | Status: AC

## 2023-07-05 MED ORDER — PROMETHAZINE-DM 6.25-15 MG/5ML PO SYRP: 5.0000 mL | ORAL_SOLUTION | Freq: Every evening | ORAL | 0 refills | Status: AC | PRN

## 2023-07-05 MED ORDER — AMOXICILLIN-POT CLAVULANATE 875-125 MG PO TABS: 1.0000 | ORAL_TABLET | Freq: Two times a day (BID) | ORAL | 0 refills | Status: DC

## 2023-07-05 MED ORDER — GUAIFENESIN ER 1200 MG PO TB12: 1200.0000 mg | ORAL_TABLET | Freq: Two times a day (BID) | ORAL | 0 refills | Status: AC

## 2023-07-05 MED ORDER — GUAIFENESIN ER 1200 MG PO TB12: 1200.0000 mg | ORAL_TABLET | Freq: Two times a day (BID) | ORAL | 0 refills | Status: DC

## 2023-07-05 NOTE — ED Provider Notes (Signed)
[name]    CSN: [id number] Arrival date & time: 07/05/23  1531      History   Chief Complaint Chief Complaint  Patient presents with   Cough    HPI Nicole [name] is a 23 y.o. female.   Patient presents urgent care for evaluation of cough, nasal congestion, and fatigue that started 1 week ago.  She reports intermittent sore throat but states this is very mild.  Reports significant sinus congestion especially to the maxillary sinus area with green nasal mucus drainage.  Denies recent fevers, chills, nausea, vomiting, diarrhea, rash, dizziness.  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  Denies shortness of breath and chest pain.  No recent antibiotic or steroid use in the last 90 days reported.  Taking over-the-counter medications with minimal relief.   Cough   Past Medical History:  Diagnosis Date   Dysmenorrhea    Hirsutism 12/15/2012   Normal labs, PCOS unlikely, monitor symptoms over time.  OCPs started for menorrhagia and dysmenorrhea.    Seasonal allergies     Patient Active Problem List   Diagnosis Date Noted   HPV (human papilloma virus) anogenital infection 11/18/2021   Abnormal lung sounds 06/14/2021   Dysphonia 12/10/2017   Globus sensation 12/10/2017   Adjustment disorder with mixed anxiety and depressed mood 05/15/2017   Voice strain 02/19/2017   Acne vulgaris 09/07/2014   Allergic rhinitis 01/31/2013    Past Surgical History:  Procedure Laterality Date   WISDOM TOOTH EXTRACTION  10/2020    OB History     Gravida  0   Para  0   Term  0   Preterm  0   AB  0   Living  0      SAB  0   IAB  0   Ectopic  0   Multiple  0   Live Births  0            Home Medications    Prior to Admission medications   Medication Sig Start Date End Date Taking? Authorizing Provider  buPROPion (WELLBUTRIN SR) 150 MG 12 hr tablet Take 150 mg by mouth 2 (two) times daily.   Yes [provider]  amoxicillin-clavulanate (AUGMENTIN) 875-125 MG tablet Take 1 tablet by mouth every 12 (twelve) hours. 07/05/23   Carlisle Beers, FNP  etonogestrel-ethinyl estradiol (NUVARING) 0.12-0.015 MG/24HR vaginal ring Insert vaginally and leave in place for 3 consecutive weeks, then remove for 1 week. 07/04/22   Adam Phenix, MD  Guaifenesin 1200 MG TB12 Take 1 tablet (1,200 mg total) by mouth in the morning and at bedtime. 07/05/23   Carlisle Beers, FNP  ibuprofen (ADVIL) 800 MG tablet TAKE 1 TABLET BY MOUTH EVERY 8 HOURS AS NEEDED 07/04/19   Alfonso Ramus T, FNP  Imiquimod 3.75 % CREA Apply a thin layer once daily (using up to 1 packet or 1 full actuation of pump) prior to bedtime; leave on skin for ~8 hours, then remove with mild soap and water. Continue treatment until there is total clearance of the warts or for a maximum duration of therapy of 8 weeks 06/15/21   Valinda Hoar, NP  promethazine-dextromethorphan (PROMETHAZINE-DM) 6.25-15 MG/5ML syrup Take 5 mLs by mouth at bedtime as needed for cough. 07/05/23   Carlisle Beers, FNP  valACYclovir (VALTREX) 500 MG tablet Taken one tablet BID for 3 days per outbreak 04/19/21   [provider]    Family History Family History  Problem Relation Age of Onset   Diabetes Maternal Grandmother    Hyperlipidemia Maternal Grandfather    Kidney cancer Maternal Grandfather    Diabetes Other    Allergic rhinitis Neg Hx    Angioedema Neg Hx    Asthma Neg Hx    Eczema Neg Hx    Urticaria Neg Hx     Social History Social History   Tobacco Use   Smoking status: Some Days    Types: Cigarettes   Smokeless tobacco: Never  Vaping Use   Vaping status: Every Day  Substance Use Topics   Alcohol use: Yes    Comment: occ   Drug use: Never     Allergies   Patient has no known allergies.   Review of Systems Review of Systems  Respiratory:  Positive for cough.   Per HPI   Physical Exam Triage Vital  Signs ED Triage Vitals  Encounter Vitals Group     BP 07/05/23 1731 116/77     Systolic BP Percentile --      Diastolic BP Percentile --      Pulse Rate 07/05/23 1731 72     Resp 07/05/23 1731 16     Temp 07/05/23 1731 98.1 F (36.7 C)     Temp Source 07/05/23 1731 Oral     SpO2 07/05/23 1731 97 %     Weight --      Height --      Head Circumference --      Peak Flow --      Pain Score 07/05/23 1739 0     Pain Loc --      Pain Education --      Exclude from Growth Chart --    No data found.  Updated Vital Signs BP 116/77 (BP Location: Right Arm)   Pulse 72   Temp 98.1 F (36.7 C) (Oral)   Resp 16   LMP 05/27/2023 (Approximate)   SpO2 97%   Visual Acuity Right Eye Distance:   Left Eye Distance:   Bilateral Distance:    Right Eye Near:   Left Eye Near:    Bilateral Near:     Physical Exam Vitals and nursing note reviewed.  Constitutional:      Appearance: She is not ill-appearing or toxic-appearing.  HENT:     Head: Normocephalic and atraumatic.     Right Ear: Hearing, tympanic membrane, ear canal and external ear normal.     Left Ear: Hearing, tympanic membrane, ear canal and external ear normal.     Nose: Congestion present.     Right Sinus: Maxillary sinus tenderness present.     Left Sinus: Maxillary sinus tenderness present.     Mouth/Throat:     Lips: Pink.     Mouth: Mucous membranes are moist. No injury or oral lesions.     Dentition: Normal dentition.     Tongue: No lesions.     Pharynx: Oropharynx is clear. Uvula midline. No pharyngeal swelling, oropharyngeal exudate, posterior oropharyngeal erythema, uvula swelling or postnasal drip.     Tonsils: No tonsillar exudate.  Eyes:     General: Lids are normal. Vision grossly intact. Gaze aligned appropriately.     Extraocular Movements: Extraocular movements intact.     Conjunctiva/sclera: Conjunctivae normal.  Neck:     Trachea: Trachea and phonation normal.  Cardiovascular:     Rate and Rhythm:  Normal rate and regular rhythm.  Heart sounds: Normal heart sounds, S1 normal and S2 normal.  Pulmonary:     Effort: Pulmonary effort is normal. No respiratory distress.     Breath sounds: Normal breath sounds and air entry. No wheezing, rhonchi or rales.  Chest:     Chest wall: No tenderness.  Musculoskeletal:     Cervical back: Neck supple.  Lymphadenopathy:     Cervical: No cervical adenopathy.  Skin:    General: Skin is warm and dry.     Capillary Refill: Capillary refill takes less than 2 seconds.     Findings: No rash.  Neurological:     General: No focal deficit present.     Mental Status: She is alert and oriented to person, place, and time. Mental status is at baseline.     Cranial Nerves: No dysarthria or facial asymmetry.  Psychiatric:        Mood and Affect: Mood normal.        Speech: Speech normal.        Behavior: Behavior normal.        Thought Content: Thought content normal.        Judgment: Judgment normal.      UC Treatments / Results  Labs (all labs ordered are listed, but only abnormal results are displayed) Labs Reviewed - No data to display  EKG   Radiology No results found.  Procedures Procedures (including critical care time)  Medications Ordered in UC Medications - No data to display  Initial Impression / Assessment and Plan / UC Course  I have reviewed the triage vital signs and the nursing notes.  Pertinent labs & imaging results that were available during my care of the patient were reviewed by me and considered in my medical decision making (see chart for details).   1.  Acute nonrecurrent sinusitis, acute cough Presentation is consistent with acute postviral bacterial sinusitis.   Symptoms have been present for greater than 8 days and have not responded well to over-the-counter therapies, therefore may have antibiotic.  Prescriptions for further symptomatic relief sent, may continue using OTC medications as needed. Deferred  imaging of the chest based on stable cardiopulmonary exam and hemodynamically stable vital signs. Recommend warm compresses to the sinuses as needed.   Counseled patient on potential for adverse effects with medications prescribed/recommended today, strict ER and return-to-clinic precautions discussed, patient verbalized understanding.    Final Clinical Impressions(s) / UC Diagnoses   Final diagnoses:  Acute non-recurrent maxillary sinusitis  Acute cough     Discharge Instructions      Your evaluation shows you have a bacterial sinus infection.  - Take antibiotic sent to pharmacy as directed to treat sinus infection. - Purchase Mucinex over the counter and take this every 12 hours as needed for nasal congestion and cough. - Cough medicines as needed. - Warm compresses to the cheeks and forehead as needed to help with sinus headaches as well as tylenol as needed.  If you develop any new or worsening symptoms or if your symptoms do not start to improve, please return here or follow-up with your primary care provider. If your symptoms are severe, please go to the emergency room.     ED Prescriptions     Medication Sig Dispense Auth. Provider   amoxicillin-clavulanate (AUGMENTIN) 875-125 MG tablet  (Status: Discontinued) Take 1 tablet by mouth every 12 (twelve) hours. 14 tablet Carlisle Beers, FNP   promethazine-dextromethorphan (PROMETHAZINE-DM) 6.25-15 MG/5ML syrup  (Status: Discontinued) Take 5 mLs by  mouth at bedtime as needed for cough. 118 mL Reita May M, FNP   Guaifenesin 1200 MG TB12  (Status: Discontinued) Take 1 tablet (1,200 mg total) by mouth in the morning and at bedtime. 14 tablet Carlisle Beers, FNP   amoxicillin-clavulanate (AUGMENTIN) 875-125 MG tablet Take 1 tablet by mouth every 12 (twelve) hours. 14 tablet Reita May M, FNP   Guaifenesin 1200 MG TB12 Take 1 tablet (1,200 mg total) by mouth in the morning and at bedtime. 14 tablet  Carlisle Beers, FNP   promethazine-dextromethorphan (PROMETHAZINE-DM) 6.25-15 MG/5ML syrup Take 5 mLs by mouth at bedtime as needed for cough. 118 mL Carlisle Beers, FNP      PDMP not reviewed this encounter.   Carlisle Beers, [place] 07/05/23 [phone]

## 2023-07-05 NOTE — ED Triage Notes (Signed)
Complains of cough and congestion for a week.  Nose feels dry, reports blood tinged nasal secretions.  Particularly at night has coughing episodes.  Feels drainage in throat.  Has taken cough medicine containing guaifenesin.

## 2023-07-05 NOTE — Discharge Instructions (Signed)
Your evaluation shows you have a bacterial sinus infection. - Take antibiotic sent to pharmacy as directed to treat sinus infection. - Purchase Mucinex over the counter and take this every 12 hours as needed for nasal congestion and cough. - Cough medicines as needed. - Warm compresses to the cheeks and forehead as needed to help with sinus headaches as well as tylenol as needed.  If you develop any new or worsening symptoms or if your symptoms do not start to improve, please return here or follow-up with your primary care provider. If your symptoms are severe, please go to the emergency room.

## 2023-07-27 ENCOUNTER — Other Ambulatory Visit: Payer: Self-pay | Admitting: Obstetrics & Gynecology

## 2023-07-27 DIAGNOSIS — Z3009 Encounter for other general counseling and advice on contraception: Secondary | ICD-10-CM

## 2023-07-31 ENCOUNTER — Other Ambulatory Visit: Payer: Self-pay

## 2023-07-31 DIAGNOSIS — Z3009 Encounter for other general counseling and advice on contraception: Secondary | ICD-10-CM

## 2023-07-31 MED ORDER — ETONOGESTREL-ETHINYL ESTRADIOL 0.12-0.015 MG/24HR VA RING: VAGINAL_RING | VAGINAL | 0 refills | Status: AC

## 2023-07-31 NOTE — Progress Notes (Signed)
RX refill for [facility] sent. Pt will need AEX before anymore refills. Made aware.

## 2023-08-28 ENCOUNTER — Other Ambulatory Visit: Payer: Self-pay | Admitting: Obstetrics & Gynecology

## 2023-08-28 DIAGNOSIS — Z3009 Encounter for other general counseling and advice on contraception: Secondary | ICD-10-CM

## 2023-10-10 ENCOUNTER — Encounter (HOSPITAL_COMMUNITY): Payer: Self-pay | Admitting: Emergency Medicine

## 2023-10-10 ENCOUNTER — Other Ambulatory Visit: Payer: Self-pay

## 2023-10-10 ENCOUNTER — Emergency Department (HOSPITAL_COMMUNITY)
Admission: EM | Admit: 2023-10-10 | Discharge: 2023-10-11 | Disposition: A | Discharge status: Home / Self Care | Attending: Emergency Medicine | Admitting: Emergency Medicine

## 2023-10-10 ENCOUNTER — Emergency Department (HOSPITAL_COMMUNITY)

## 2023-10-10 DIAGNOSIS — Y9351 Activity, roller skating (inline) and skateboarding: Secondary | ICD-10-CM | POA: Diagnosis not present

## 2023-10-10 DIAGNOSIS — M25532 Pain in left wrist: Secondary | ICD-10-CM | POA: Diagnosis present

## 2023-10-10 DIAGNOSIS — S52125A Nondisplaced fracture of head of left radius, initial encounter for closed fracture: Secondary | ICD-10-CM | POA: Diagnosis not present

## 2023-10-10 MED ORDER — HYDROCODONE-ACETAMINOPHEN 5-325 MG PO TABS
1.0000 | ORAL_TABLET | Freq: Once | ORAL | Status: AC
Start: 2023-10-10 — End: 2023-10-10
  Administered 2023-10-10: 1 via ORAL
  Filled 2023-10-10: qty 1

## 2023-10-10 NOTE — ED Triage Notes (Addendum)
 Patient c/o left arm pain after she fell while roller skating. No obvious injury. Patient 8/10 left hand pain radiating to her elbow.

## 2023-10-11 DIAGNOSIS — S52125A Nondisplaced fracture of head of left radius, initial encounter for closed fracture: Secondary | ICD-10-CM | POA: Diagnosis not present

## 2023-10-11 MED ORDER — HYDROCODONE-ACETAMINOPHEN 5-325 MG PO TABS: 1.0000 | ORAL_TABLET | Freq: Four times a day (QID) | ORAL | 0 refills | Status: AC | PRN

## 2023-10-11 NOTE — Progress Notes (Signed)
 Orthopedic Tech Progress Note Patient Details:  Nicole [name] [facility] 2000-11-26 [id number]  Ortho Devices Type of Ortho Device: Sugartong splint Ortho Device/Splint Location: LUE Ortho Device/Splint Interventions: Ordered, Application, Adjustment   Post Interventions Patient Tolerated: Well Instructions Provided: Care of device, Adjustment of device  [name] 10/11/2023, 12:48 AM
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
## 2023-10-11 NOTE — Discharge Instructions (Signed)
 As we discussed, x-ray imaging did show that you broke your arm.  Given this, we have placed you in a splint and given you a sling to wear for support as well.  You need to wear the splint at all times until you see orthopedics and will need to cover it with a plastic bag when you take a shower.  I have given you a referral to orthopedics with a number to call to schedule an appointment for follow-up.  Please call at your earliest convenience.  In the interim, I recommend that you take ibuprofen  and Tylenol  as needed for pain.  I have also given you a prescription for Vicodin which is a narcotic pain medication you can take as prescribed as needed for severe breakthrough pain only.  Do not drive or operate heavy machinery while taking this medication as it can be sedating.  Return if development of any new or worsening symptoms.

## 2023-10-11 NOTE — ED Notes (Signed)
 Ortho Tech at bedside to apply long arm splint.

## 2023-10-16 ENCOUNTER — Other Ambulatory Visit (INDEPENDENT_AMBULATORY_CARE_PROVIDER_SITE_OTHER): Payer: Self-pay

## 2023-10-16 ENCOUNTER — Ambulatory Visit (INDEPENDENT_AMBULATORY_CARE_PROVIDER_SITE_OTHER): Admitting: Orthopaedic Surgery

## 2023-10-16 DIAGNOSIS — S52125A Nondisplaced fracture of head of left radius, initial encounter for closed fracture: Secondary | ICD-10-CM

## 2023-10-16 NOTE — Progress Notes (Signed)
 Office Visit Note   Patient: Nicole [name]           Date of Birth: [birth year]           MRN: [id number] Visit Date: 10/16/2023              Requested by: No referring provider defined for this encounter. PCP: Pcp, No   Assessment & Plan: Visit Diagnoses:  1. Closed nondisplaced fracture of head of left radius, initial encounter     History of Present Illness Nicole [name] is a 23 year old female who presents with left elbow pain following a roller skating fall.  She experiences severe left elbow pain with difficulty moving her elbow and fingers following a fall while roller skating. 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 Numbness is present in her fingers while wearing a splint, though she retains touch sensation. Finger movement is limited and painful, with an inability to make a full fist or give a thumbs up. Swelling is noted in the fingers. She is concerned about exacerbating the injury by moving her arm and has no prior fractures, which contributes to her anxiety. The sling causes discomfort in her back, particularly behind the shoulder blade, due to tension from guarding the arm. She seeks information on recovery time and returning to activities like gym workouts and weightlifting.  Physical Exam MUSCULOSKELETAL: Exam very limited by guarding and apprehension.  No obvious neurovascular compromise.  Results RADIOLOGY Elbow X-ray: Nondisplaced radial head fracture  Assessment and Plan Nondisplaced left radial head fracture Emphasized movement to prevent stiffness and ensure recovery. - Discontinue splint; use sling as needed. - Encourage elbow and finger movement within discomfort limits. - Refer to occupational therapy for movement assistance. - Follow-up in two weeks to reassess  Follow-Up Instructions: Return in about 2 weeks (around 10/30/2023).   Orders:  Orders Placed This Encounter  Procedures   XR Elbow 2 Views Left    Ambulatory referral to Occupational Therapy   No orders of the defined types were placed in this encounter.     Subjective: Chief Complaint  Patient presents with   Other    Left arm injury 10/10/23-fell while roller skating    HPI  Review of Systems  Constitutional: Negative.   HENT: Negative.    Eyes: Negative.   Respiratory: Negative.    Cardiovascular: Negative.   Endocrine: Negative.   Musculoskeletal: Negative.   Neurological: Negative.   Hematological: Negative.   Psychiatric/Behavioral: Negative.    All other systems reviewed and are negative.    Objective: Vital Signs: There were no vitals taken for this visit.  Physical Exam Vitals and nursing note reviewed.  Constitutional:      Appearance: She is well-developed.  HENT:     Head: Atraumatic.     Nose: Nose normal.  Eyes:     Extraocular Movements: Extraocular movements intact.  Cardiovascular:     Pulses: Normal pulses.  Pulmonary:     Effort: Pulmonary effort is normal.  Abdominal:     Palpations: Abdomen is soft.  Musculoskeletal:     Cervical back: Neck supple.  Skin:    General: Skin is warm.     Capillary Refill: Capillary refill takes less than 2 seconds.  Neurological:     Mental Status: She is alert. Mental status is at baseline.  Psychiatric:        Behavior: Behavior normal.        Thought Content: Thought  content normal.        Judgment: Judgment normal.     Ortho Exam  Specialty Comments:  No specialty comments available.  Imaging: XR Elbow 2 Views Left Result Date: 10/16/2023 X-rays of the left elbow show nondisplaced radial head fracture.    PMFS History: Patient Active Problem List   Diagnosis Date Noted   HPV (human papilloma virus) anogenital infection 11/18/2021   Abnormal lung sounds 06/14/2021   Dysphonia 12/10/2017   Globus sensation 12/10/2017   Adjustment disorder with mixed anxiety and depressed mood 05/15/2017   Voice strain 02/19/2017   Acne  vulgaris 09/07/2014   Allergic rhinitis 01/31/2013   Past Medical History:  Diagnosis Date   Dysmenorrhea    Hirsutism 12/15/2012   Normal labs, PCOS unlikely, monitor symptoms over time.  OCPs started for menorrhagia and dysmenorrhea.    Seasonal allergies     Family History  Problem Relation Age of Onset   Diabetes Maternal Grandmother    Hyperlipidemia Maternal Grandfather    Kidney cancer Maternal Grandfather    Diabetes Other    Allergic rhinitis Neg Hx    Angioedema Neg Hx    Asthma Neg Hx    Eczema Neg Hx    Urticaria Neg Hx     Past Surgical History:  Procedure Laterality Date   WISDOM TOOTH EXTRACTION  10/2020   Social History   Occupational History   Not on file  Tobacco Use   Smoking status: Some Days    Types: Cigarettes   Smokeless tobacco: Never  Vaping Use   Vaping status: Every Day  Substance and Sexual Activity   Alcohol use: Yes    Comment: occ   Drug use: Never   Sexual activity: Yes    Partners: Male    Birth control/protection: I.U.D.

## 2023-10-18 ENCOUNTER — Encounter: Payer: Self-pay | Admitting: Occupational Therapy

## 2023-10-18 ENCOUNTER — Other Ambulatory Visit: Payer: Self-pay

## 2023-10-18 ENCOUNTER — Ambulatory Visit: Attending: Orthopaedic Surgery | Admitting: Occupational Therapy

## 2023-10-18 DIAGNOSIS — R278 Other lack of coordination: Secondary | ICD-10-CM

## 2023-10-18 DIAGNOSIS — S52125A Nondisplaced fracture of head of left radius, initial encounter for closed fracture: Secondary | ICD-10-CM

## 2023-10-18 DIAGNOSIS — R29818 Other symptoms and signs involving the nervous system: Secondary | ICD-10-CM

## 2023-10-18 DIAGNOSIS — R29898 Other symptoms and signs involving the musculoskeletal system: Secondary | ICD-10-CM | POA: Diagnosis present

## 2023-10-18 DIAGNOSIS — M6281 Muscle weakness (generalized): Secondary | ICD-10-CM | POA: Diagnosis present

## 2023-10-18 NOTE — Patient Instructions (Addendum)
 Swelling Reduction  Place affected area above the level of your heart. You can use pillows for support. Move the affected area. Think about tightening the muscles in this area and then relaxing them.  "Pet" the area from the outside of your body toward the center of your body. If it is your hand, from the tips of your fingers to the shoulder or chest.   [email or web address] Access Code: [email or web address]

## 2023-10-18 NOTE — Therapy (Signed)
 OUTPATIENT OCCUPATIONAL THERAPY ORTHO EVALUATION  Patient Name: Nicole [name] MRN: [id number] DOB:2001/06/01, 23 y.o., female Today's Date: 10/18/2023  PCP: Sharma Dears, NP  REFERRING PROVIDER: Wes Hamman, MD  END OF SESSION:  OT End of Session - 10/18/23 1535     Visit Number 1    Number of Visits 17    Date for OT Re-Evaluation 12/21/23    Authorization Type [employer]    OT Start Time 1535    OT Stop Time 1615    OT Time Calculation (min) 40 min    Activity Tolerance Patient tolerated treatment well    Behavior During Therapy [facility] for tasks assessed/performed            Past Medical History:  Diagnosis Date   Dysmenorrhea    Hirsutism 12/15/2012   Normal labs, PCOS unlikely, monitor symptoms over time.  OCPs started for menorrhagia and dysmenorrhea.    Seasonal allergies    Past Surgical History:  Procedure Laterality Date   WISDOM TOOTH EXTRACTION  10/2020   Patient Active Problem List   Diagnosis Date Noted   HPV (human papilloma virus) anogenital infection 11/18/2021   Abnormal lung sounds 06/14/2021   Dysphonia 12/10/2017   Globus sensation 12/10/2017   Adjustment disorder with mixed anxiety and depressed mood 05/15/2017   Voice strain 02/19/2017   Acne vulgaris 09/07/2014   Allergic rhinitis 01/31/2013   ONSET DATE: 10/16/2023 (Date of referral); 10/10/23 - date of injury  REFERRING DIAG: M25.522 (ICD-10-CM) - Pain in left elbow  THERAPY DIAG:  Other symptoms and signs involving the musculoskeletal system  Closed nondisplaced fracture of head of left radius, initial encounter  Other lack of coordination  Muscle weakness (generalized)  Other symptoms and signs involving the nervous system  Rationale for Evaluation and Treatment: Rehabilitation  SUBJECTIVE:   SUBJECTIVE STATEMENT: Pt reports she fell while roller blading. She had been wearing her elbow pads and then removed them just before she fell. She wants to get back to going to  the gym and lifting.   Pt accompanied by: self  PERTINENT HISTORY: PMH - adjustment disorder with mixed anxiety and depressed mood and HPV  Assessment and Plan Nondisplaced left radial head fracture Emphasized movement to prevent stiffness and ensure recovery. - Discontinue splint; use sling as needed. - Encourage elbow and finger movement within discomfort limits. - Refer to occupational therapy for movement assistance. - Follow-up in two weeks to reassess  PRECAUTIONS: Other: ROM as tolerated  RED FLAGS: None   WEIGHT BEARING RESTRICTIONS: No  PAIN:  Are you having pain? Yes: NPRS scale: 2/10 Pain location: L elbow Pain description: stiffness Aggravating factors: positioning Relieving factors: movement  FALLS: Has patient fallen in last 6 months? Yes. 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  HAND DOMINANCE: Right  ADLs: WFL  FUNCTIONAL OUTCOME MEASURES: Quick Dash: 84.1% disability with use of LUE   UPPER EXTREMITY ROM:     LUE AROM -  Lacks 35* elbow ext - 90* flexion 80* shoulder flexion 60* wrist ext; 60*  wrist flex  HAND FUNCTION: Grip strength: Right: 51.8 lbs; Left: 4.1 lbs  COORDINATION: 9 Hole Peg test: Right: 20 sec; Left: 27 sec  SENSATION: Paresthesias reported along radial distribution of L hand  EDEMA: mild reported and observed  COGNITION: Overall cognitive status: Within functional limits for tasks assessed  OBSERVATIONS: Pt appears well-kept. Guarding tendencies with LUE. Ambulates  without AD. No LOB.   TREATMENT:                                                                                                                             OT educated pt on edema reduction strategies and LUE ROM HEP as noted in pt instructions.   PATIENT EDUCATION: Education details: OT Role and POC Person educated: Patient Education method: Explanation, Demonstration, and Handouts Education comprehension: verbalized understanding, returned demonstration, and needs further education  HOME EXERCISE PROGRAM: 10/18/2023: LUE ROM; edema reduction Access Code: [email or web address]  GOALS:  SHORT TERM GOALS: Target date: 11/15/2023  Patient will demonstrate initial L UE HEP with 25% verbal cues or less for proper execution. Baseline: Goal status: IN PROGRESS  2.  Pt will verbalize understanding of edema reduction techniques as needed to improve pain and ROM with LUE.  Baseline:  Goal status: INITIAL  LONG TERM GOALS: Target date: 12/21/2023  Patient will demonstrate updated L UE HEP with visual handouts only for proper execution. Baseline:  Goal status: INITIAL  2.  Patient will demonstrate at least 16% improvement with quick Dash score (reporting 68.1% disability or less) indicating improved functional use of affected extremity. Baseline: 84.1% disability with use of LUE Goal status: INITIAL  3.  Patient will demonstrate at least 30 lbs L grip strength as needed to open jars and other containers. Baseline: Left: 4.1 lbs Goal status: INITIAL  4.  Patient will demo improved FM coordination as evidenced by completing nine-hole peg with use of L in 22 seconds or less. Baseline: Left: 27 sec Goal status: INITIAL  ASSESSMENT:  CLINICAL IMPRESSION: Patient is a 23 y.o. female who was seen today for occupational therapy evaluation for L elbow pain. Hx includes adjustment disorder with mixed anxiety and depressed mood and HPV. Patient currently presents below baseline level of functioning  demonstrating functional deficits and impairments as noted below. Pt would benefit from skilled OT services in the outpatient setting to work on impairments as noted below to help pt return to PLOF as able.    PERFORMANCE DEFICITS: in functional skills including ADLs, IADLs, coordination, sensation, edema, ROM, strength, pain, Fine motor control, Gross motor control, endurance, and UE functional use.   IMPAIRMENTS: are limiting patient from ADLs, IADLs, rest and sleep, work, leisure, and social participation.   COMORBIDITIES: may have co-morbidities  that affects occupational performance. Patient will benefit from skilled OT to address above impairments and improve overall function.  MODIFICATION OR ASSISTANCE TO COMPLETE EVALUATION: Min-Moderate modification of tasks or assist with assess necessary to complete an evaluation.  OT  OCCUPATIONAL PROFILE AND HISTORY: Detailed assessment: Review of records and additional review of physical, cognitive, psychosocial history related to current functional performance.  CLINICAL DECISION MAKING: Moderate - several treatment options, min-mod task modification necessary  REHAB POTENTIAL: Good  EVALUATION COMPLEXITY: Moderate    PLAN:  OT FREQUENCY: 2x/week  OT DURATION: 8 weeks  PLANNED INTERVENTIONS: 97168 OT Re-evaluation, 97535 self care/ADL training, 76283 therapeutic exercise, 97530 therapeutic activity, 97112 neuromuscular re-education, 97140 manual therapy, 97035 ultrasound, 97018 paraffin, 15176 fluidotherapy, 97010 moist heat, 97034 contrast bath, 97760 Orthotic Initial, 97763 Orthotic/Prosthetic subsequent, passive range of motion, patient/family education, and DME and/or AE instructions  RECOMMENDED OTHER SERVICES: N/A for this visit  CONSULTED AND AGREED WITH PLAN OF CARE: Patient  PLAN FOR NEXT SESSION: Review LUE ROM HEP; US ; putty HEP   Altamease Asters, OT 10/18/2023, 5:25 PM

## 2023-10-19 ENCOUNTER — Telehealth: Payer: Self-pay

## 2023-10-19 NOTE — Telephone Encounter (Signed)
 Patients OT therapist contacted the office. Patient is requested a work note to leave work early to attend therapy as needed. OK to do?

## 2023-10-19 NOTE — Telephone Encounter (Signed)
 Note created and sent to MyChart. Patient notified via MyChart as well.

## 2023-10-19 NOTE — Telephone Encounter (Signed)
 yes

## 2023-10-23 ENCOUNTER — Ambulatory Visit: Admitting: Occupational Therapy

## 2023-10-23 DIAGNOSIS — R29898 Other symptoms and signs involving the musculoskeletal system: Secondary | ICD-10-CM | POA: Diagnosis not present

## 2023-10-23 DIAGNOSIS — M6281 Muscle weakness (generalized): Secondary | ICD-10-CM

## 2023-10-23 DIAGNOSIS — R278 Other lack of coordination: Secondary | ICD-10-CM

## 2023-10-23 DIAGNOSIS — R29818 Other symptoms and signs involving the nervous system: Secondary | ICD-10-CM

## 2023-10-23 DIAGNOSIS — S52125A Nondisplaced fracture of head of left radius, initial encounter for closed fracture: Secondary | ICD-10-CM

## 2023-10-23 NOTE — Therapy (Signed)
 OUTPATIENT OCCUPATIONAL THERAPY ORTHO TREATMENT  Patient Name: Nicole [name] MRN: [id number] DOB:July 14, 2000, 23 y.o., female Today's Date: 10/23/2023  PCP: Sharma Dears, NP  REFERRING PROVIDER: Wes Hamman, MD  END OF SESSION:  OT End of Session - 10/23/23 1622     Visit Number 2    Number of Visits 17    Date for OT Re-Evaluation 12/21/23    Authorization Type [employer]    OT Start Time 1622    OT Stop Time 1700    OT Time Calculation (min) 38 min    Activity Tolerance Patient tolerated treatment well    Behavior During Therapy WFL for tasks assessed/performed            Past Medical History:  Diagnosis Date   Dysmenorrhea    Hirsutism 12/15/2012   Normal labs, PCOS unlikely, monitor symptoms over time.  OCPs started for menorrhagia and dysmenorrhea.    Seasonal allergies    Past Surgical History:  Procedure Laterality Date   WISDOM TOOTH EXTRACTION  10/2020   Patient Active Problem List   Diagnosis Date Noted   HPV (human papilloma virus) anogenital infection 11/18/2021   Abnormal lung sounds 06/14/2021   Dysphonia 12/10/2017   Globus sensation 12/10/2017   Adjustment disorder with mixed anxiety and depressed mood 05/15/2017   Voice strain 02/19/2017   Acne vulgaris 09/07/2014   Allergic rhinitis 01/31/2013   ONSET DATE: 10/16/2023 (Date of referral); 10/10/23 - date of injury  REFERRING DIAG: M25.522 (ICD-10-CM) - Pain in left elbow  THERAPY DIAG:  Closed nondisplaced fracture of head of left radius, initial encounter  Other lack of coordination  Muscle weakness (generalized)  Other symptoms and signs involving the musculoskeletal system  Other symptoms and signs involving the nervous system  Rationale for Evaluation and Treatment: Rehabilitation  SUBJECTIVE:   SUBJECTIVE STATEMENT: Pt reports she feels like her shoulder is moving better.   Pt accompanied by: self  PERTINENT HISTORY: PMH - adjustment disorder with mixed anxiety and  depressed mood and HPV  Assessment and Plan Nondisplaced left radial head fracture Emphasized movement to prevent stiffness and ensure recovery. - Discontinue splint; use sling as needed. - Encourage elbow and finger movement within discomfort limits. - Refer to occupational therapy for movement assistance. - Follow-up in two weeks to reassess  PRECAUTIONS: Other: ROM as tolerated  RED FLAGS: None   WEIGHT BEARING RESTRICTIONS: No  PAIN:  Are you having pain? Yes: NPRS scale: 0/10 Pain location: L lateral forearm/elbow Pain description: stiffness Aggravating factors: positioning Relieving factors: movement  Worst pain in last 24 hours: 3/10   FALLS: Has patient fallen in last 6 months? Yes. 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  HAND DOMINANCE: Right  ADLs: WFL  FUNCTIONAL OUTCOME MEASURES: Quick Dash: 84.1% disability with use of LUE   UPPER EXTREMITY ROM:     LUE AROM -  Lacks 35* elbow ext - 90* flexion 80* shoulder flexion 60* wrist ext; 60* wrist flex  HAND FUNCTION: Grip strength: Right: 51.8 lbs; Left: 4.1 lbs 10/23/2023:  Left: 9.7 lbs  COORDINATION: 9 Hole Peg test: Right: 20 sec; Left: 27 sec  SENSATION: Paresthesias reported along radial distribution of L hand  EDEMA: mild reported and observed  COGNITION: Overall cognitive status: Within functional limits for tasks assessed  OBSERVATIONS: Pt appears well-kept. Guarding tendencies with LUE. Ambulates without AD. No LOB.   TREATMENT:            Reviewed prior LUE tabletop HEP as noted in pt instructions.  Pt required min A for proper completion.                                                                                                                  OT initiated LUE red theraputty exercises (press downs, search, grip, pinch, key grip, spread, and rope squeeze) as noted in patient instructions for coordination and strength OT educated pt on table top play of [employer] for BUE ROM, coordination, visual processing, scanning, and sequencing. Pt required minimal cues for proper play.   PATIENT EDUCATION: Education details: putty hep; ROM Person educated: Patient Education method: Explanation, Demonstration, and Handouts Education comprehension: verbalized understanding, returned demonstration, and needs further education  HOME EXERCISE PROGRAM: 10/18/2023: LUE ROM; edema reduction Access Code: [email or web address] 10/23/2023: red putty HEP  GOALS:  SHORT TERM GOALS: Target date: 11/15/2023  Patient will demonstrate initial L UE HEP with 25% verbal cues or less for proper execution. Baseline: Goal status: IN PROGRESS  2.  Pt will verbalize understanding of edema reduction techniques as needed to improve pain and ROM with LUE.  Baseline:  Goal status: INITIAL  LONG TERM GOALS: Target date: 12/21/2023  Patient will demonstrate updated L UE HEP with visual handouts only for proper execution. Baseline:  Goal status: INITIAL  2.  Patient will demonstrate at least 16% improvement with quick Dash score (reporting 68.1% disability or less) indicating improved functional use of affected extremity. Baseline: 84.1% disability with use of LUE Goal status: INITIAL  3.  Patient will demonstrate at least 30 lbs L grip strength as needed to open jars and other containers. Baseline: Left: 4.1 lbs Goal status: INITIAL  4.  Patient will demo improved FM coordination as evidenced by completing nine-hole peg with use of L in 22 seconds or  less. Baseline: Left: 27 sec Goal status: INITIAL  ASSESSMENT:  CLINICAL IMPRESSION: Patient demonstrates good understanding of HEP as needed to progress towards goals. Good improvement from previous session. Will incorporate more coordination exercises during next visit.   PERFORMANCE DEFICITS: in functional skills including ADLs, IADLs, coordination, sensation, edema, ROM, strength, pain, Fine motor control, Gross motor control, endurance, and UE functional use.   IMPAIRMENTS: are limiting patient from ADLs, IADLs, rest and sleep, work, leisure, and social participation.   COMORBIDITIES: may have co-morbidities  that affects occupational performance. Patient will benefit from skilled OT to address above impairments and improve overall function.  REHAB POTENTIAL: Good  PLAN:  OT FREQUENCY: 2x/week  OT DURATION: 8 weeks  PLANNED INTERVENTIONS: 16109 OT Re-evaluation, 97535 self care/ADL  training, 09811 therapeutic exercise, 97530 therapeutic activity, 97112 neuromuscular re-education, 97140 manual therapy, 97035 ultrasound, 97018 paraffin, 91478 fluidotherapy, 97010 moist heat, 97034 contrast bath, 97760 Orthotic Initial, 97763 Orthotic/Prosthetic subsequent, passive range of motion, patient/family education, and DME and/or AE instructions  RECOMMENDED OTHER SERVICES: N/A for this visit  CONSULTED AND AGREED WITH PLAN OF CARE: Patient  PLAN FOR NEXT SESSION: US ; Review putty HEP; initiate coordination HEP   Altamease Asters, OT 10/23/2023, 5:15 PM

## 2023-10-24 ENCOUNTER — Telehealth: Payer: Self-pay

## 2023-10-24 NOTE — Telephone Encounter (Signed)
 Patient's therapist reached out again about a note for patient to attend OT appointments. I have called patient and left a message that work note was sent to her MyChart account on 10/19/2023. I have resent it again just in case.

## 2023-10-29 NOTE — Progress Notes (Signed)
 Office Visit Note   Patient: Nicole [name]           Date of Birth: [birth year]           MRN: [id number] Visit Date: 10/30/2023              Requested by: No referring provider defined for this encounter. PCP: Sharma Dears, NP   Assessment & Plan: Visit Diagnoses:  1. Closed nondisplaced fracture of head of left radius, initial encounter     Plan: History of Present Illness Nicole [name] is a 23 year old female who presents for follow-up of an elbow fracture.  She is three weeks post-injury from a left radial head fracture with significant improvement in symptoms. She has difficulty with full extension and performing tasks due to residual swelling. Current range of motion is approximately fifteen degrees of extension to one hundred twenty degrees of flexion. She has not returned to weightlifting and seeks guidance on resuming this activity.  Physical Exam MUSCULOSKELETAL: Elbow range of motion 15 degrees extension to 120 degrees flexion  Results RADIOLOGY Elbow X-ray: Shows a healing minimally displaced left radial head fracture.  (10/30/2023)  Assessment and Plan Left radial head fracture X-rays show good healing progress. Residual swelling and limited range of motion typical at this stage.  - Advise against weight-bearing or lifting more than a couple of pounds until six weeks post-injury. - Encourage stretching exercises to improve range of motion, using the other arm to assist in flexion and extension. - Schedule follow-up with Loris Ros in three weeks for repeat x-ray and assessment of healing progress.  Follow-Up Instructions: Return in about 3 weeks (around 11/20/2023) for lindsey stanbery.   Orders:  Orders Placed This Encounter  Procedures   XR Elbow 2 Views Left   No orders of the defined types were placed in this encounter.   Subjective: Chief Complaint  Patient presents with   Left Elbow - Follow-up, Fracture    HPI  Review of  Systems  Constitutional: Negative.   HENT: Negative.    Eyes: Negative.   Respiratory: Negative.    Cardiovascular: Negative.   Endocrine: Negative.   Musculoskeletal: Negative.   Neurological: Negative.   Hematological: Negative.   Psychiatric/Behavioral: Negative.    All other systems reviewed and are negative.    Objective: Vital Signs: There were no vitals taken for this visit.  Physical Exam Vitals and nursing note reviewed.  Constitutional:      Appearance: She is well-developed.  HENT:     Head: Normocephalic and atraumatic.  Pulmonary:     Effort: Pulmonary effort is normal.  Abdominal:     Palpations: Abdomen is soft.  Musculoskeletal:     Cervical back: Neck supple.  Skin:    General: Skin is warm.     Capillary Refill: Capillary refill takes less than 2 seconds.  Neurological:     Mental Status: She is alert and oriented to person, place, and time.  Psychiatric:        Behavior: Behavior normal.        Thought Content: Thought content normal.        Judgment: Judgment normal.     Ortho Exam  Specialty Comments:  No specialty comments available.  Imaging: XR Elbow 2 Views Left Result Date: 10/30/2023 X-rays of the left elbow show a healing minimally displaced radial head fracture.    PMFS History: Patient Active Problem List   Diagnosis Date Noted   HPV (human  papilloma virus) anogenital infection 11/18/2021   Abnormal lung sounds 06/14/2021   Dysphonia 12/10/2017   Globus sensation 12/10/2017   Adjustment disorder with mixed anxiety and depressed mood 05/15/2017   Voice strain 02/19/2017   Acne vulgaris 09/07/2014   Allergic rhinitis 01/31/2013   Past Medical History:  Diagnosis Date   Dysmenorrhea    Hirsutism 12/15/2012   Normal labs, PCOS unlikely, monitor symptoms over time.  OCPs started for menorrhagia and dysmenorrhea.    Seasonal allergies     Family History  Problem Relation Age of Onset   Diabetes Maternal Grandmother     Hyperlipidemia Maternal Grandfather    Kidney cancer Maternal Grandfather    Diabetes Other    Allergic rhinitis Neg Hx    Angioedema Neg Hx    Asthma Neg Hx    Eczema Neg Hx    Urticaria Neg Hx     Past Surgical History:  Procedure Laterality Date   WISDOM TOOTH EXTRACTION  10/2020   Social History   Occupational History   Not on file  Tobacco Use   Smoking status: Some Days    Types: Cigarettes   Smokeless tobacco: Never  Vaping Use   Vaping status: Every Day  Substance and Sexual Activity   Alcohol use: Yes    Comment: occ   Drug use: Never   Sexual activity: Yes    Partners: Male    Birth control/protection: I.U.D.

## 2023-10-30 ENCOUNTER — Other Ambulatory Visit (INDEPENDENT_AMBULATORY_CARE_PROVIDER_SITE_OTHER)

## 2023-10-30 ENCOUNTER — Ambulatory Visit (INDEPENDENT_AMBULATORY_CARE_PROVIDER_SITE_OTHER): Admitting: Orthopaedic Surgery

## 2023-10-30 DIAGNOSIS — S52125A Nondisplaced fracture of head of left radius, initial encounter for closed fracture: Secondary | ICD-10-CM

## 2023-10-31 ENCOUNTER — Ambulatory Visit: Admitting: Occupational Therapy

## 2023-11-01 ENCOUNTER — Ambulatory Visit: Admitting: Occupational Therapy

## 2023-11-01 DIAGNOSIS — S52125A Nondisplaced fracture of head of left radius, initial encounter for closed fracture: Secondary | ICD-10-CM

## 2023-11-01 DIAGNOSIS — M6281 Muscle weakness (generalized): Secondary | ICD-10-CM

## 2023-11-01 DIAGNOSIS — R278 Other lack of coordination: Secondary | ICD-10-CM

## 2023-11-01 DIAGNOSIS — R29898 Other symptoms and signs involving the musculoskeletal system: Secondary | ICD-10-CM

## 2023-11-01 DIAGNOSIS — R29818 Other symptoms and signs involving the nervous system: Secondary | ICD-10-CM

## 2023-11-01 NOTE — Patient Instructions (Addendum)
      Elbow Ext Stretch Back against wall, bend elbow and keep palm up "holding a silver platter," then straighten elbow. Focus on engaging triceps and stretching Biceps.  Avoid rotating arm.  30 second holds, 3 sets 4-5x per day

## 2023-11-01 NOTE — Therapy (Signed)
 OUTPATIENT OCCUPATIONAL THERAPY ORTHO TREATMENT  Patient Name: Nicole [name] MRN: [id number] DOB:[birth year], 23 y.o., female Today's Date: 11/01/2023  PCP: Sharma Dears, NP  REFERRING PROVIDER: Wes Hamman, MD  END OF SESSION:  OT End of Session - 11/01/23 1634     Visit Number 3    Number of Visits 17    Date for OT Re-Evaluation 12/21/23    Authorization Type [employer]    OT Start Time 1542   pt requested to leave to get theraputty out of car   OT Stop Time 1624    OT Time Calculation (min) 42 min    Activity Tolerance Patient tolerated treatment well    Behavior During Therapy [facility] for tasks assessed/performed             Past Medical History:  Diagnosis Date   Dysmenorrhea    Hirsutism 12/15/2012   Normal labs, PCOS unlikely, monitor symptoms over time.  OCPs started for menorrhagia and dysmenorrhea.    Seasonal allergies    Past Surgical History:  Procedure Laterality Date   WISDOM TOOTH EXTRACTION  10/2020   Patient Active Problem List   Diagnosis Date Noted   HPV (human papilloma virus) anogenital infection 11/18/2021   Abnormal lung sounds 06/14/2021   Dysphonia 12/10/2017   Globus sensation 12/10/2017   Adjustment disorder with mixed anxiety and depressed mood 05/15/2017   Voice strain 02/19/2017   Acne vulgaris 09/07/2014   Allergic rhinitis 01/31/2013   ONSET DATE: 10/16/2023 (Date of referral); 10/10/23 - date of injury  REFERRING DIAG: M25.522 (ICD-10-CM) - Pain in left elbow  THERAPY DIAG:  Closed nondisplaced fracture of head of left radius, initial encounter  Other lack of coordination  Muscle weakness (generalized)  Other symptoms and signs involving the musculoskeletal system  Other symptoms and signs involving the nervous system  Rationale for Evaluation and Treatment: Rehabilitation  SUBJECTIVE:   SUBJECTIVE STATEMENT: Name pronunciation: "[name]"  Pt reported asking leaving theraputty in car and requested to  retrieve theraputty at beginning of session. OT agreed and educated pt on avoiding leaving theraputty in hot cars and pt verbalized understanding. Pt reported seeing Dr Christiane Cowing recently who recommended to continue to work on elbow ROM. Pt demo'd AAROM elbow ext. Pt reported continued difficulty with washing face with affected arm.   Pt accompanied by: self  PERTINENT HISTORY: PMH - adjustment disorder with mixed anxiety and depressed mood and HPV  Assessment and Plan Nondisplaced left radial head fracture Emphasized movement to prevent stiffness and ensure recovery. - Discontinue splint; use sling as needed. - Encourage elbow and finger movement within discomfort limits. - Refer to occupational therapy for movement assistance. - Follow-up in two weeks to reassess  PRECAUTIONS: Other: ROM as tolerated  RED FLAGS: None   WEIGHT BEARING RESTRICTIONS: No  PAIN:  Are you having pain? 4/10 in morning "sore after sleeping on it wrong," 2/10 L shoulder "tired and "sore"  FALLS: Has patient fallen in last 6 months? Yes. 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  HAND DOMINANCE: Right  ADLs: WFL  FUNCTIONAL OUTCOME MEASURES: Quick Dash: 84.1% disability with use of LUE   UPPER EXTREMITY ROM:     LUE AROM -  Lacks 35* elbow ext - 90* flexion 80* shoulder flexion 60* wrist ext; 60* wrist flex  HAND FUNCTION: Grip strength: Right: 51.8 lbs; Left: 4.1 lbs 10/23/2023: Left: 9.7 lbs  COORDINATION: 9 Hole Peg test: Right: 20 sec; Left: 27  sec  SENSATION: Paresthesias reported along radial distribution of L hand  EDEMA: mild reported and observed  COGNITION: Overall cognitive status: Within functional limits for tasks assessed  OBSERVATIONS: Pt appears well-kept. Guarding tendencies with LUE. Ambulates without AD. No LOB.   TREATMENT:            Self-Care OT educated pt on sleep positioning. Handout provided, see pt instructions. Pt verbalized understanding.  Pt asked about possibly swimming this upcoming weekend. OT educated pt on precautions and protocol, recommended avoiding swimming to allow time for healing though f/u with doctor about any questions/concerns about specific activities for final determination. Pt verbalized understanding.  OT educated pt on OT role, UE anatomy, dx, prognosis, healing process, protocol, precautions. Pt acknowledged understanding.   TherAct OT assessed grip strength, see associated goal below for updates.  OT initiated FM coordination HEP (handout provided, see pt instructions) - to improve affected UE FM coordination, dexterity, proprioception. Pt returned demonstration of each exercise: Picking up objects and placing in a container with slot Stacking towers of coins  Finger-to-palm then palm-to-finger translation of small items Shuffling cards Turning cards over 1 at a time Hold deck of cards in palm of hand and push off 1 card at a time from the top of the deck using only thumb Tear piece of tissue and rolling into small balls with fingertips Meditation balls - clockwise then counterclockwise Rolling pen between fingers and thumb, pen "twirl" (rotation), pen "shift" (translation)  TherEx Elbow ext at wall AAROM - to improve affected UE ROM, to decrease affected UE stiffness. Pt returned demo with fading therapist modeling and v/c. Handout provided, see pt instructions.   PATIENT EDUCATION: Education details: see today's tx above Person educated: Patient Education method:  Explanation, Demonstration, and Handouts Education comprehension: verbalized understanding, returned demonstration, and needs further education  HOME EXERCISE PROGRAM: 10/18/2023: LUE ROM; edema reduction Access Code: [email or web address] 10/23/2023: red putty HEP 11/01/23 - elbow ext at wall AAROM (see pt instructions)  GOALS:  SHORT TERM GOALS: Target date: 11/15/2023  Patient will demonstrate initial L UE HEP with 25% verbal cues or less for proper execution. Baseline: Goal status: IN PROGRESS  2.  Pt will verbalize understanding of edema reduction techniques as needed to improve pain and ROM with LUE.  Baseline:  Goal status: INITIAL  LONG TERM GOALS: Target date: 12/21/2023  Patient will demonstrate updated L UE HEP with visual handouts only for proper execution. Baseline:  Goal status: INITIAL  2.  Patient will demonstrate at least 16% improvement with quick Dash score (reporting 68.1% disability or less) indicating improved functional use of affected extremity. Baseline: 84.1% disability with use of LUE Goal status: INITIAL  3.  Patient will demonstrate at least 30 lbs L grip strength as needed to open jars and other containers. Baseline: Left: 4.1 lbs 11/01/23 - LUE: 31.7, 23.5, 34.1 (29.7 lbs average) with shaking noted, pt reported slight soreness.  Goal status: In progress  4.  Patient will demo improved FM coordination as evidenced by completing nine-hole peg with use of L in 22 seconds or  less. Baseline: Left: 27 sec Goal status: INITIAL  ASSESSMENT:  CLINICAL IMPRESSION: Pt tolerated tasks well. Pt demo'd greatly improved grip strength of affected UE. Pt demo'ing ongoing stiffness with affected elbow ext ROM though returned demo of HEP. Patient demonstrates good understanding of HEP as needed to progress towards goals. Continue POC. Pt would benefit from skilled OT services to address deficits, increase overall independence, and return patient to PLOF as able.    PERFORMANCE  DEFICITS: in functional skills including ADLs, IADLs, coordination, sensation, edema, ROM, strength, pain, Fine motor control, Gross motor control, endurance, and UE functional use.   IMPAIRMENTS: are limiting patient from ADLs, IADLs, rest and sleep, work, leisure, and social participation.   COMORBIDITIES: may have co-morbidities  that affects occupational performance. Patient will benefit from skilled OT to address above impairments and improve overall function.  REHAB POTENTIAL: Good  PLAN:  OT FREQUENCY: 2x/week  OT DURATION: 8 weeks  PLANNED INTERVENTIONS: 97168 OT Re-evaluation, 97535 self care/ADL training, 16109 therapeutic exercise, 97530 therapeutic activity, 97112 neuromuscular re-education, 97140 manual therapy, 97035 ultrasound, 97018 paraffin, 60454 fluidotherapy, 97010 moist heat, 97034 contrast bath, 97760 Orthotic Initial, 97763 Orthotic/Prosthetic subsequent, passive range of motion, patient/family education, and DME and/or AE instructions  RECOMMENDED OTHER SERVICES: N/A for this visit  CONSULTED AND AGREED WITH PLAN OF CARE: Patient  PLAN FOR NEXT SESSION: US ; Review putty and coordination HEP; continue to progress ROM   Oakley Bellman, OT 11/01/2023, 4:45 PM

## 2023-11-06 ENCOUNTER — Ambulatory Visit: Admitting: Occupational Therapy

## 2023-11-06 NOTE — Therapy (Deleted)
 OUTPATIENT OCCUPATIONAL THERAPY ORTHO TREATMENT  Patient Name: Nicole [name] MRN: [id number] DOB:11/04/2000, 23 y.o., female Today's Date: 11/06/2023  PCP: Sharma Dears, NP  REFERRING PROVIDER: Wes Hamman, MD  END OF SESSION:    Past Medical History:  Diagnosis Date   Dysmenorrhea    Hirsutism 12/15/2012   Normal labs, PCOS unlikely, monitor symptoms over time.  OCPs started for menorrhagia and dysmenorrhea.    Seasonal allergies    Past Surgical History:  Procedure Laterality Date   WISDOM TOOTH EXTRACTION  10/2020   Patient Active Problem List   Diagnosis Date Noted   HPV (human papilloma virus) anogenital infection 11/18/2021   Abnormal lung sounds 06/14/2021   Dysphonia 12/10/2017   Globus sensation 12/10/2017   Adjustment disorder with mixed anxiety and depressed mood 05/15/2017   Voice strain 02/19/2017   Acne vulgaris 09/07/2014   Allergic rhinitis 01/31/2013   ONSET DATE: 10/16/2023 (Date of referral); 10/10/23 - date of injury  REFERRING DIAG: M25.522 (ICD-10-CM) - Pain in left elbow  THERAPY DIAG:  No diagnosis found.  Rationale for Evaluation and Treatment: Rehabilitation  SUBJECTIVE:   SUBJECTIVE STATEMENT: Name pronunciation: "[name]"  Pt reported asking leaving theraputty in car and requested to retrieve theraputty at beginning of session. OT agreed and educated pt on avoiding leaving theraputty in hot cars and pt verbalized understanding. Pt reported seeing Dr Christiane Cowing recently who recommended to continue to work on elbow ROM. Pt demo'd AAROM elbow ext. Pt reported continued difficulty with washing face with affected arm.   Pt accompanied by: self  PERTINENT HISTORY: PMH - adjustment disorder with mixed anxiety and depressed mood and HPV  Assessment and Plan Nondisplaced left radial head fracture Emphasized movement to prevent stiffness and ensure recovery. - Discontinue splint; use sling as needed. - Encourage elbow and finger  movement within discomfort limits. - Refer to occupational therapy for movement assistance. - Follow-up in two weeks to reassess  PRECAUTIONS: Other: ROM as tolerated  RED FLAGS: None   WEIGHT BEARING RESTRICTIONS: No  PAIN:  Are you having pain? 4/10 in morning "sore after sleeping on it wrong," 2/10 L shoulder "tired and "sore"  FALLS: Has patient fallen in last 6 months? Yes. 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  HAND DOMINANCE: Right  ADLs: WFL  FUNCTIONAL OUTCOME MEASURES: Quick Dash: 84.1% disability with use of LUE   UPPER EXTREMITY ROM:     LUE AROM -  Lacks 35* elbow ext - 90* flexion 80* shoulder flexion 60* wrist ext; 60* wrist flex  HAND FUNCTION: Grip strength: Right: 51.8 lbs; Left: 4.1 lbs 10/23/2023: Left: 9.7 lbs  COORDINATION: 9 Hole Peg test: Right: 20 sec; Left: 27 sec  SENSATION: Paresthesias reported along radial distribution of L hand  EDEMA: mild reported and observed  COGNITION: Overall cognitive status: Within functional limits for tasks assessed  OBSERVATIONS: Pt appears well-kept. Guarding tendencies with LUE. Ambulates without AD. No LOB.   TREATMENT:            Self-Care OT educated pt on sleep  positioning. Handout provided, see pt instructions. Pt verbalized understanding.  Pt asked about possibly swimming this upcoming weekend. OT educated pt on precautions and protocol, recommended avoiding swimming to allow time for healing though f/u with doctor about any questions/concerns  about specific activities for final determination. Pt verbalized understanding.  OT educated pt on OT role, UE anatomy, dx, prognosis, healing process, protocol, precautions. Pt acknowledged understanding.   TherAct OT assessed grip strength, see associated goal below for updates.  OT initiated FM coordination HEP (handout provided, see pt instructions) - to improve affected UE FM coordination, dexterity, proprioception. Pt returned demonstration of each exercise: Picking up objects and placing in a container with slot Stacking towers of coins  Finger-to-palm then palm-to-finger translation of small items Shuffling cards Turning cards over 1 at a time Hold deck of cards in palm of hand and push off 1 card at a time from the top of the deck using only thumb Tear piece of tissue and rolling into small balls with fingertips Meditation balls - clockwise then counterclockwise Rolling pen between fingers and thumb, pen "twirl" (rotation), pen "shift" (translation)  TherEx Elbow ext at wall AAROM - to improve affected UE ROM, to decrease affected UE stiffness. Pt returned demo with fading therapist modeling and v/c. Handout provided, see pt instructions.   PATIENT EDUCATION: Education details: see today's tx above Person educated: Patient Education method: Explanation, Demonstration, and Handouts Education comprehension: verbalized understanding, returned demonstration, and needs further education  HOME EXERCISE PROGRAM: 10/18/2023: LUE ROM; edema reduction Access Code: [email or web address] 10/23/2023: red putty HEP 11/01/23 - elbow ext at wall AAROM (see pt instructions)  GOALS:  SHORT TERM GOALS: Target date: 11/15/2023  Patient will demonstrate initial L UE HEP with 25% verbal cues or less for proper execution. Baseline: Goal status: IN PROGRESS  2.  Pt will verbalize understanding of edema reduction techniques as needed to improve pain and ROM with LUE.  Baseline:  Goal status: INITIAL  LONG  TERM GOALS: Target date: 12/21/2023  Patient will demonstrate updated L UE HEP with visual handouts only for proper execution. Baseline:  Goal status: INITIAL  2.  Patient will demonstrate at least 16% improvement with quick Dash score (reporting 68.1% disability or less) indicating improved functional use of affected extremity. Baseline: 84.1% disability with use of LUE Goal status: INITIAL  3.  Patient will demonstrate at least 30 lbs L grip strength as needed to open jars and other containers. Baseline: Left: 4.1 lbs 11/01/23 - LUE: 31.7, 23.5, 34.1 (29.7 lbs average) with shaking noted, pt reported slight soreness.  Goal status: In progress  4.  Patient will demo improved FM coordination as evidenced by completing nine-hole peg with use of L in 22 seconds or less. Baseline: Left: 27 sec Goal status: INITIAL  ASSESSMENT:  CLINICAL IMPRESSION: Pt tolerated tasks well. Pt demo'd greatly improved grip strength of affected UE. Pt demo'ing ongoing stiffness with affected elbow ext ROM though returned demo of HEP. Patient demonstrates good understanding of HEP as needed to progress towards goals. Continue POC. Pt would benefit from skilled OT services to address deficits, increase overall independence, and return patient to PLOF as able.    PERFORMANCE DEFICITS: in functional skills including ADLs, IADLs, coordination, sensation, edema, ROM, strength, pain, Fine motor control, Gross motor control, endurance, and UE functional use.   IMPAIRMENTS: are limiting patient from ADLs, IADLs, rest and sleep, work, leisure, and social participation.   COMORBIDITIES: may have co-morbidities  that affects occupational performance. Patient  will benefit from skilled OT to address above impairments and improve overall function.  REHAB POTENTIAL: Good  PLAN:  OT FREQUENCY: 2x/week  OT DURATION: 8 weeks  PLANNED INTERVENTIONS: 97168 OT Re-evaluation, 97535 self care/ADL training, 53664 therapeutic  exercise, 97530 therapeutic activity, 97112 neuromuscular re-education, 97140 manual therapy, 97035 ultrasound, 97018 paraffin, 40347 fluidotherapy, 97010 moist heat, 97034 contrast bath, 97760 Orthotic Initial, 97763 Orthotic/Prosthetic subsequent, passive range of motion, patient/family education, and DME and/or AE instructions  RECOMMENDED OTHER SERVICES: N/A for this visit  CONSULTED AND AGREED WITH PLAN OF CARE: Patient  PLAN FOR NEXT SESSION: US ; Review putty and coordination HEP; continue to progress ROM   Altamease Asters, OT 11/06/2023, 11:00 AM

## 2023-11-08 ENCOUNTER — Ambulatory Visit: Admitting: Occupational Therapy

## 2023-11-08 DIAGNOSIS — M6281 Muscle weakness (generalized): Secondary | ICD-10-CM

## 2023-11-08 DIAGNOSIS — R29898 Other symptoms and signs involving the musculoskeletal system: Secondary | ICD-10-CM

## 2023-11-08 DIAGNOSIS — R278 Other lack of coordination: Secondary | ICD-10-CM

## 2023-11-08 DIAGNOSIS — R29818 Other symptoms and signs involving the nervous system: Secondary | ICD-10-CM

## 2023-11-08 DIAGNOSIS — S52125A Nondisplaced fracture of head of left radius, initial encounter for closed fracture: Secondary | ICD-10-CM

## 2023-11-08 NOTE — Therapy (Signed)
 OUTPATIENT OCCUPATIONAL THERAPY ORTHO TREATMENT  Patient Name: Nicole [name] MRN: [id number] DOB:2000/09/28, 23 y.o., female Today's Date: 11/08/2023  PCP: Sharma Dears, NP  REFERRING PROVIDER: Wes Hamman, MD  END OF SESSION:  OT End of Session - 11/08/23 1550     Visit Number 4    Number of Visits 17    Date for OT Re-Evaluation 12/21/23    Authorization Type [employer]    OT Start Time 1551    OT Stop Time 1615    OT Time Calculation (min) 24 min    Activity Tolerance Patient tolerated treatment well    Behavior During Therapy [facility] for tasks assessed/performed            Past Medical History:  Diagnosis Date   Dysmenorrhea    Hirsutism 12/15/2012   Normal labs, PCOS unlikely, monitor symptoms over time.  OCPs started for menorrhagia and dysmenorrhea.    Seasonal allergies    Past Surgical History:  Procedure Laterality Date   WISDOM TOOTH EXTRACTION  10/2020   Patient Active Problem List   Diagnosis Date Noted   HPV (human papilloma virus) anogenital infection 11/18/2021   Abnormal lung sounds 06/14/2021   Dysphonia 12/10/2017   Globus sensation 12/10/2017   Adjustment disorder with mixed anxiety and depressed mood 05/15/2017   Voice strain 02/19/2017   Acne vulgaris 09/07/2014   Allergic rhinitis 01/31/2013   ONSET DATE: 10/16/2023 (Date of referral); 10/10/23 - date of injury  REFERRING DIAG: M25.522 (ICD-10-CM) - Pain in left elbow  THERAPY DIAG:  Closed nondisplaced fracture of head of left radius, initial encounter  Other lack of coordination  Muscle weakness (generalized)  Other symptoms and signs involving the musculoskeletal system  Other symptoms and signs involving the nervous system  Rationale for Evaluation and Treatment: Rehabilitation  SUBJECTIVE:   SUBJECTIVE STATEMENT: Name pronunciation: "[name]"  Pt reports she was unaware of appointment she missed on Tuesday. Overall, she feels she is doing better. Has not  attempted to play piano recently.   Pt accompanied by: self  PERTINENT HISTORY: PMH - adjustment disorder with mixed anxiety and depressed mood and HPV  Assessment and Plan Nondisplaced left radial head fracture Emphasized movement to prevent stiffness and ensure recovery. - Discontinue splint; use sling as needed. - Encourage elbow and finger movement within discomfort limits. - Refer to occupational therapy for movement assistance. - Follow-up in two weeks to reassess  PRECAUTIONS: Other: ROM as tolerated  RED FLAGS: None   WEIGHT BEARING RESTRICTIONS: No  PAIN:  Are you having pain? 0/10  FALLS: Has patient fallen in last 6 months? Yes. 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  HAND DOMINANCE: Right  ADLs: WFL  FUNCTIONAL OUTCOME MEASURES: Quick Dash: 84.1% disability with use of LUE   UPPER EXTREMITY ROM:     LUE AROM -  Lacks 35* elbow ext - 90* flexion 80* shoulder flexion 60* wrist ext; 60* wrist flex  HAND FUNCTION: Grip strength: Right: 51.8 lbs; Left: 4.1 lbs 10/23/2023: Left: 9.7 lbs  COORDINATION: [address]  Peg test: Right: 20 sec; Left: 27 sec  SENSATION: Paresthesias reported along radial distribution of L hand  EDEMA: mild reported and observed  COGNITION: Overall cognitive status: Within functional limits for tasks assessed  OBSERVATIONS: Pt appears well-kept. Guarding tendencies with LUE. Ambulates without AD. No LOB.   TREATMENT:            TherEx  Pt completed arm bike in sitting for 5  minutes with average RPM of 30 at level 7 for endurance, ROM, and strengthening of affected extremity using reciprocal arm movements. Pt alternating direction of pedaling halfway through. Intermittent cues provided to maintain stability with respect to anterior/posterior trunk lean and consistent grasp maintenance.  5 lb digiflex L hand with individual button depression x 10  each finger with 3 second hold for strengthening and coordination of affected extremity   7 lb digiflex L hand with gross grasp and 3-second hold x 10 for strengthening of affected extremity`   Placement and removal of yellow, red, green, blue, and black resistive clips with use of left 3 point pinch for strengthening of affected extremity. Pt able to place yellow, red, and green clips vertically for additional challenge to shoulder ROM.  Wrist jux-a-cisor with use of left with cues to keep full grasp on base at all times x5 for improved wrist ROM and grip strength of affected extremity  PATIENT EDUCATION: Education details: see today's tx above Person educated: Patient Education method: Explanation, Demonstration, and Verbal cues Education comprehension: verbalized understanding, returned demonstration, and needs further education  HOME EXERCISE PROGRAM: 10/18/2023: LUE ROM; edema reduction Access Code: [email or web address] 10/23/2023: red putty HEP 11/01/23 - elbow ext at wall AAROM (see pt instructions)  GOALS:  SHORT TERM GOALS: Target date: 11/15/2023  Patient will demonstrate initial L UE HEP with 25% verbal cues or less for proper execution. Baseline: Goal status: MET  2.  Pt will verbalize understanding of edema reduction techniques as needed to improve pain and ROM with LUE.  Baseline:  Goal status: INITIAL  LONG TERM GOALS: Target date: 12/21/2023  Patient will demonstrate updated L UE HEP with visual handouts only for proper execution. Baseline:  Goal status: INITIAL  2.  Patient will demonstrate at least 16%  improvement with quick Dash score (reporting 68.1% disability or less) indicating improved functional use of affected extremity. Baseline: 84.1% disability with use of LUE Goal status: INITIAL  3.  Patient will demonstrate at least 30 lbs L grip strength as needed to open jars and other containers. Baseline: Left: 4.1 lbs 11/01/23 - LUE: 31.7, 23.5, 34.1 (29.7 lbs average) with shaking noted, pt reported slight soreness.  Goal status: In progress  4.  Patient will demo improved FM coordination as evidenced by completing nine-hole peg with use of L in 22 seconds or less. Baseline: Left: 27 sec Goal status: INITIAL  ASSESSMENT:  CLINICAL IMPRESSION: Pt demo'd greatly improved elbow AROM of affected UE only lacking 10* extension. Patient continues to demonstrate good understanding of HEP as needed to progress towards goals. Will focus on strength and endurance with LUE use during upcoming visits.    PERFORMANCE DEFICITS: in functional skills including ADLs, IADLs, coordination, sensation, edema, ROM, strength, pain, Fine motor control, Gross motor control, endurance, and UE functional use.   IMPAIRMENTS: are limiting patient from ADLs, IADLs, rest and sleep, work, leisure, and social participation.   COMORBIDITIES: may have co-morbidities  that affects occupational performance. Patient will benefit from skilled OT to address above impairments and improve overall  function.  REHAB POTENTIAL: Good  PLAN:  OT FREQUENCY: 2x/week  OT DURATION: 8 weeks  PLANNED INTERVENTIONS: 97168 OT Re-evaluation, 97535 self care/ADL training, 16109 therapeutic exercise, 97530 therapeutic activity, 97112 neuromuscular re-education, 97140 manual therapy, 97035 ultrasound, 97018 paraffin, 60454 fluidotherapy, 97010 moist heat, 97034 contrast bath, 97760 Orthotic Initial, 97763 Orthotic/Prosthetic subsequent, passive range of motion, patient/family education, and DME and/or AE instructions  RECOMMENDED OTHER  SERVICES: N/A for this visit  CONSULTED AND AGREED WITH PLAN OF CARE: Patient  PLAN FOR NEXT SESSION: jux-a-cisor; Looper  Assess goal progression  US  or tape prn for pain relief; Review putty and coordination HEP; continue to progress ROM; rebounder   Altamease Asters, OT 11/08/2023, 6:17 PM

## 2023-11-12 ENCOUNTER — Encounter: Admitting: Occupational Therapy

## 2023-11-14 ENCOUNTER — Ambulatory Visit: Attending: Orthopaedic Surgery | Admitting: Occupational Therapy

## 2023-11-14 DIAGNOSIS — R29818 Other symptoms and signs involving the nervous system: Secondary | ICD-10-CM | POA: Diagnosis present

## 2023-11-14 DIAGNOSIS — S52125A Nondisplaced fracture of head of left radius, initial encounter for closed fracture: Secondary | ICD-10-CM | POA: Diagnosis present

## 2023-11-14 DIAGNOSIS — M6281 Muscle weakness (generalized): Secondary | ICD-10-CM

## 2023-11-14 DIAGNOSIS — R278 Other lack of coordination: Secondary | ICD-10-CM | POA: Diagnosis present

## 2023-11-14 DIAGNOSIS — M25622 Stiffness of left elbow, not elsewhere classified: Secondary | ICD-10-CM | POA: Diagnosis present

## 2023-11-14 DIAGNOSIS — R29898 Other symptoms and signs involving the musculoskeletal system: Secondary | ICD-10-CM

## 2023-11-14 NOTE — Therapy (Signed)
 OUTPATIENT OCCUPATIONAL THERAPY ORTHO TREATMENT  Patient Name: Nicole [name] MRN: [id number] DOB:August 21, 2000, 23 y.o., female Today's Date: 11/14/2023  PCP: Sharma Dears, NP  REFERRING PROVIDER: Wes Hamman, MD  END OF SESSION:  OT End of Session - 11/14/23 1538     Visit Number 5    Number of Visits 17    Date for OT Re-Evaluation 12/21/23    Authorization Type [employer]    OT Start Time 1538    OT Stop Time 1616    OT Time Calculation (min) 38 min    Activity Tolerance Patient tolerated treatment well    Behavior During Therapy WFL for tasks assessed/performed            Past Medical History:  Diagnosis Date   Dysmenorrhea    Hirsutism 12/15/2012   Normal labs, PCOS unlikely, monitor symptoms over time.  OCPs started for menorrhagia and dysmenorrhea.    Seasonal allergies    Past Surgical History:  Procedure Laterality Date   WISDOM TOOTH EXTRACTION  10/2020   Patient Active Problem List   Diagnosis Date Noted   HPV (human papilloma virus) anogenital infection 11/18/2021   Abnormal lung sounds 06/14/2021   Dysphonia 12/10/2017   Globus sensation 12/10/2017   Adjustment disorder with mixed anxiety and depressed mood 05/15/2017   Voice strain 02/19/2017   Acne vulgaris 09/07/2014   Allergic rhinitis 01/31/2013   ONSET DATE: 10/16/2023 (Date of referral); 10/10/23 - date of injury  REFERRING DIAG: M25.522 (ICD-10-CM) - Pain in left elbow  THERAPY DIAG:  Closed nondisplaced fracture of head of left radius, initial encounter  Other lack of coordination  Muscle weakness (generalized)  Other symptoms and signs involving the musculoskeletal system  Other symptoms and signs involving the nervous system  Rationale for Evaluation and Treatment: Rehabilitation  SUBJECTIVE:   SUBJECTIVE STATEMENT: Name pronunciation: "[name]"  She really wants to get back to swimming. She has not been doing a lot of her HEPs as she has been busy with the end of  the school year.   Pt accompanied by: self  PERTINENT HISTORY: PMH - adjustment disorder with mixed anxiety and depressed mood and HPV  Assessment and Plan Nondisplaced left radial head fracture Emphasized movement to prevent stiffness and ensure recovery. - Discontinue splint; use sling as needed. - Encourage elbow and finger movement within discomfort limits. - Refer to occupational therapy for movement assistance. - Follow-up in two weeks to reassess  PRECAUTIONS: Other: ROM as tolerated  RED FLAGS: None   WEIGHT BEARING RESTRICTIONS: No  PAIN:  Are you having pain? 0/10  FALLS: Has patient fallen in last 6 months? Yes. 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  HAND DOMINANCE: Right  ADLs: WFL  FUNCTIONAL OUTCOME MEASURES: Quick Dash: 84.1% disability with use of LUE   UPPER EXTREMITY ROM:     LUE AROM -  Lacks 35* elbow ext - 90* flexion 80* shoulder flexion 60* wrist ext; 60* wrist flex  HAND FUNCTION: Grip strength: Right: 51.8 lbs; Left: 4.1 lbs 10/23/2023: Left: 9.7  lbs  COORDINATION: 9 Hole Peg test: Right: 20 sec; Left: 27 sec  SENSATION: Paresthesias reported along radial distribution of L hand  EDEMA: mild reported and observed  COGNITION: Overall cognitive status: Within functional limits for tasks assessed  OBSERVATIONS: Pt appears well-kept. Guarding tendencies with LUE. Ambulates without AD. No LOB.   TREATMENT:            TherEx for ROM, endurance, and strength Objective measures  assessed as noted in Goals section to determine progression towards goals and level of difficulty for subsequent exercise activitites. Wrist flex and ext with yellow (6 lbs) flex bar x 2 min for strength and endurance of affected extremity  Supination with yellow (6 lbs) flex bar x 2 min for strength and endurance of affected extremity  Pronation with yellow (6 lbs) flex bar x 2 min for strength and endurance of affected extremity  Wrist jux-a-cisor with use of left with cues to keep full grasp on base at all times x5 for improved wrist ROM and grip strength of affected extremity  Using looper hoop, patient completed left wrist circles with flexed wrist and then with extended wrist for 2 minutes for improved ROM.  With use of left, pt placed and then removed colored, small pegs into corresponding hole with use of pattern sheets for ROM, coordination, and strength of affected extremity. Pt picked up 3 pegs, stored them in hand, and then placed pegs one at a time using 2 point pinch for additional fine motor challenge. Grooved Pegs with LUE for increased coordination. Pt placed pegs with one at a time and in hand manipulation and removed with one at a time. Pt completed with mild difficulty and limited drops.   PATIENT EDUCATION: Education details: see today's tx above Person educated: Patient Education method: Explanation, Demonstration, and Verbal cues Education comprehension: verbalized understanding, returned demonstration, and needs further education  HOME EXERCISE PROGRAM: 10/18/2023: LUE ROM; edema reduction Access Code: [email or web address] 10/23/2023: red putty HEP 11/01/23 - elbow ext at wall AAROM (see pt instructions)  GOALS:  SHORT TERM GOALS: Target date: 11/15/2023  Patient will demonstrate initial L UE HEP with 25% verbal cues or less for proper execution. Baseline: Goal status: MET  2.  Pt will verbalize understanding of edema reduction techniques as needed to improve pain and ROM with  LUE.  Baseline:  Goal status: MET  LONG TERM GOALS: Target date: 12/21/2023  Patient will demonstrate updated L UE HEP with visual handouts only for proper execution. Baseline:  Goal status: INITIAL  2.  Patient will demonstrate at least 16% improvement with quick Dash score (reporting 68.1% disability or less) indicating improved functional use of affected extremity. Baseline: 84.1% disability with use of LUE Goal status: INITIAL  3.  Patient will demonstrate at least 30 lbs L grip strength as needed to open jars and other containers. Baseline: Left: 4.1 lbs 11/01/23 - LUE: 31.7, 23.5, 34.1 (29.7 lbs average) with shaking noted, pt reported slight soreness.  Goal status: In progress  4.  Patient will demo improved FM coordination as evidenced by completing nine-hole peg with use of L in 22 seconds or less. Baseline: Left: 27 sec 11/14/2023: 24 sec Goal status: IN PROGRESS  ASSESSMENT:  CLINICAL IMPRESSION: Pt progressing towards goals as expected given current stage of healing. Will review ortho recommendations at next visit and progress as able.   PERFORMANCE DEFICITS: in functional skills including ADLs, IADLs, coordination, sensation, edema, ROM, strength, pain, Fine motor control, Gross motor control, endurance,  and UE functional use.   IMPAIRMENTS: are limiting patient from ADLs, IADLs, rest and sleep, work, leisure, and social participation.   COMORBIDITIES: may have co-morbidities  that affects occupational performance. Patient will benefit from skilled OT to address above impairments and improve overall function.  REHAB POTENTIAL: Good  PLAN:  OT FREQUENCY: 2x/week  OT DURATION: 8 weeks  PLANNED INTERVENTIONS: 97168 OT Re-evaluation, 97535 self care/ADL training, 16109 therapeutic exercise, 97530 therapeutic activity, 97112 neuromuscular re-education, 97140 manual therapy, 97035 ultrasound, 97018 paraffin, 60454 fluidotherapy, 97010 moist heat, 97034 contrast bath,  97760 Orthotic Initial, 97763 Orthotic/Prosthetic subsequent, passive range of motion, patient/family education, and DME and/or AE instructions  RECOMMENDED OTHER SERVICES: N/A for this visit  CONSULTED AND AGREED WITH PLAN OF CARE: Patient  PLAN FOR NEXT SESSION: Begin light strengthening (see ortho follow-up to update precautions); high level coordination   Altamease Asters, OT 11/14/2023, 4:44 PM

## 2023-11-20 ENCOUNTER — Encounter: Payer: Self-pay | Admitting: Physician Assistant

## 2023-11-20 ENCOUNTER — Ambulatory Visit (INDEPENDENT_AMBULATORY_CARE_PROVIDER_SITE_OTHER): Admitting: Physician Assistant

## 2023-11-20 ENCOUNTER — Other Ambulatory Visit (INDEPENDENT_AMBULATORY_CARE_PROVIDER_SITE_OTHER): Payer: Self-pay

## 2023-11-20 ENCOUNTER — Ambulatory Visit: Admitting: Occupational Therapy

## 2023-11-20 ENCOUNTER — Telehealth: Payer: Self-pay | Admitting: Occupational Therapy

## 2023-11-20 DIAGNOSIS — S52125A Nondisplaced fracture of head of left radius, initial encounter for closed fracture: Secondary | ICD-10-CM | POA: Diagnosis not present

## 2023-11-20 NOTE — Progress Notes (Signed)
 Office Visit Note   Patient: Nicole [name]           Date of Birth: [birth year]           MRN: [id number] Visit Date: 11/20/2023              Requested by: Sharma Dears, NP [address] [place],  [place] [address] PCP: Sharma Dears, NP   Assessment & Plan: Visit Diagnoses:  1. Closed nondisplaced fracture of head of left radius, initial encounter     Plan: Impression is approximately 6 weeks status post left radial head fracture.  Patient is clinically and radiographically improving.  She will continue with occupational therapy where she may begin strengthening exercises.  No lifting more than 10 pounds.  Follow-up in 6 weeks for repeat evaluation and x-rays of the left elbow.  Follow-Up Instructions: Return in about 6 weeks (around 01/01/2024).   Orders:  Orders Placed This Encounter  Procedures   XR Elbow Complete Left (3+View)   No orders of the defined types were placed in this encounter.     Procedures: No procedures performed   Clinical Data: No additional findings.   Subjective: Chief Complaint  Patient presents with   Left Elbow - Follow-up    Radial head fracture    HPI patient is a pleasant 23 year old who comes in today approximately 6 weeks status post left radial head fracture.  She has been doing well.  No pain.  She has been in therapy making good progress.     Objective: Vital Signs: There were no vitals taken for this visit.    Ortho Exam examination of the left elbow reveals very little tenderness at the fracture site.  Full extension.  She does lack a few degrees with flexion and does have some discomfort with this.  Full supination and pronation.  She is neurovascularly intact distally.  Specialty Comments:  No specialty comments available.  Imaging: XR Elbow Complete Left (3+View) Result Date: 11/20/2023 X-rays demonstrate consolidation of the fracture site    PMFS History: Patient Active  Problem List   Diagnosis Date Noted   HPV (human papilloma virus) anogenital infection 11/18/2021   Abnormal lung sounds 06/14/2021   Dysphonia 12/10/2017   Globus sensation 12/10/2017   Adjustment disorder with mixed anxiety and depressed mood 05/15/2017   Voice strain 02/19/2017   Acne vulgaris 09/07/2014   Allergic rhinitis 01/31/2013   Past Medical History:  Diagnosis Date   Dysmenorrhea    Hirsutism 12/15/2012   Normal labs, PCOS unlikely, monitor symptoms over time.  OCPs started for menorrhagia and dysmenorrhea.    Seasonal allergies     Family History  Problem Relation Age of Onset   Diabetes Maternal Grandmother    Hyperlipidemia Maternal Grandfather    Kidney cancer Maternal Grandfather    Diabetes Other    Allergic rhinitis Neg Hx    Angioedema Neg Hx    Asthma Neg Hx    Eczema Neg Hx    Urticaria Neg Hx     Past Surgical History:  Procedure Laterality Date   WISDOM TOOTH EXTRACTION  10/2020   Social History   Occupational History   Not on file  Tobacco Use   Smoking status: Some Days    Types: Cigarettes   Smokeless tobacco: Never  Vaping Use   Vaping status: Every Day  Substance and Sexual Activity   Alcohol use: Yes    Comment: occ   Drug use:  Never   Sexual activity: Yes    Partners: Male    Birth control/protection: I.U.D.

## 2023-11-20 NOTE — Telephone Encounter (Signed)
 This is to document my attempt to call patient after no-show for OT appt this PM.  This is patient's # 2nd missed no-show appt.   Primary phone number(s) was used in efforts to contact the patient.   Spoke to patient to remind them of upcoming therapy visit this Thursday afternoon.  Pt apologized for missing appt today and reported she would be here on Thursday.

## 2023-11-22 ENCOUNTER — Ambulatory Visit: Admitting: Occupational Therapy

## 2023-11-22 ENCOUNTER — Telehealth: Payer: Self-pay | Admitting: Occupational Therapy

## 2023-11-22 NOTE — Telephone Encounter (Signed)
 This is to document my attempt to call patient after no-show for OT appt this PM.  This is patient's # 3 missed appt.   Primary phone number(s) was used in efforts to contact the patient.   Voice mail was left requesting the patient call the clinic back at [phone] to confirm upcoming appointment and reminding them of the clinic's attendance policy.

## 2023-11-27 ENCOUNTER — Ambulatory Visit: Admitting: Occupational Therapy

## 2023-11-27 DIAGNOSIS — M6281 Muscle weakness (generalized): Secondary | ICD-10-CM

## 2023-11-27 DIAGNOSIS — S52125A Nondisplaced fracture of head of left radius, initial encounter for closed fracture: Secondary | ICD-10-CM | POA: Diagnosis not present

## 2023-11-27 DIAGNOSIS — R278 Other lack of coordination: Secondary | ICD-10-CM

## 2023-11-27 DIAGNOSIS — M25622 Stiffness of left elbow, not elsewhere classified: Secondary | ICD-10-CM

## 2023-11-27 DIAGNOSIS — R29898 Other symptoms and signs involving the musculoskeletal system: Secondary | ICD-10-CM

## 2023-11-27 NOTE — Patient Instructions (Signed)
 Access Code: [email or web address] URL: [email or web address] Date: 11/27/2023 Prepared by: Sudie Ely  Exercises - Seated Elbow Flexion with Self-Anchored Resistance  - 1 x daily - 10 reps - Seated Elbow Extension with Self-Anchored Resistance  - 1 x daily - 10 reps - Seated Bilateral Shoulder External Rotation with Resistance  - 1 x daily - 10 reps - Seated Shoulder Flexion with Self-Anchored Resistance  - 1 x daily - 10 reps - Seated Shoulder Diagonal Pulls with Resistance  - 1 x daily - 10 reps - Standing Single Arm Shoulder PNF D1 Extension with Anchored Resistance  - 1 x daily - 10 reps - Standing Shoulder Single Arm PNF D2 Extension with Anchored Resistance  - 1 x daily - 10 reps - Single Arm Shoulder Extension with Anchored Resistance  - 1 x daily - 10 reps

## 2023-11-27 NOTE — Therapy (Signed)
 OUTPATIENT OCCUPATIONAL THERAPY ORTHO TREATMENT  Patient Name: Nicole [name] MRN: [id number] DOB:01-Mar-2001, 23 y.o., female Today's Date: 11/27/2023  PCP: Sharma Dears, NP  REFERRING PROVIDER: Wes Hamman, MD  END OF SESSION:  OT End of Session - 11/27/23 1540     Visit Number 6    Number of Visits 17    Date for OT Re-Evaluation 12/21/23    Authorization Type Aetna    Authorization Time Period VL: [place] Auth Not Reqd    OT Start Time 1540    OT Stop Time 1635    OT Time Calculation (min) 55 min    Equipment Utilized During Treatment UBE, theraband, testing material    Activity Tolerance Patient tolerated treatment well    Behavior During Therapy WFL for tasks assessed/performed         Past Medical History:  Diagnosis Date   Dysmenorrhea    Hirsutism 12/15/2012   Normal labs, PCOS unlikely, monitor symptoms over time.  OCPs started for menorrhagia and dysmenorrhea.    Seasonal allergies    Past Surgical History:  Procedure Laterality Date   WISDOM TOOTH EXTRACTION  10/2020   Patient Active Problem List   Diagnosis Date Noted   HPV (human papilloma virus) anogenital infection 11/18/2021   Abnormal lung sounds 06/14/2021   Dysphonia 12/10/2017   Globus sensation 12/10/2017   Adjustment disorder with mixed anxiety and depressed mood 05/15/2017   Voice strain 02/19/2017   Acne vulgaris 09/07/2014   Allergic rhinitis 01/31/2013   ONSET DATE: 10/16/2023 (Date of referral); 10/10/23 - date of injury  REFERRING DIAG: M25.522 (ICD-10-CM) - Pain in left elbow  THERAPY DIAG:  Muscle weakness (generalized)  Other lack of coordination  Other symptoms and signs involving the musculoskeletal system  Stiffness of left elbow, not elsewhere classified  Rationale for Evaluation and Treatment: Rehabilitation  SUBJECTIVE:   SUBJECTIVE STATEMENT: Name pronunciation: [name]  Pt reports she finished with school last week and got everything organized so she  apologized for missing her therapy appts.    Pt reports some issues with pulling and popping in her elbow when she moves it randomly ie) weird and sometimes when driving.    Pt had ortho appt 11/20/23 with following note: She will continue with occupational therapy where she may begin strengthening exercises. No lifting more than 10 pounds. Follow-up in 6 weeks for repeat evaluation and x-rays of the left elbow.   Pt reports she went to the gym yesterday and used 5-10 lb weights.  She asked about piliates and is discouraged form weight bearing activities until cleared by MD.  Pt accompanied by: self  PERTINENT HISTORY: PMH - adjustment disorder with mixed anxiety and depressed mood and HPV  Assessment and Plan Nondisplaced left radial head fracture Emphasized movement to prevent stiffness and ensure recovery. - Discontinue splint; use sling as needed. - Encourage elbow and finger movement within discomfort limits. - Refer to occupational therapy for movement assistance. - Follow-up in two weeks to reassess  PRECAUTIONS: Other: ROM as tolerated  RED FLAGS: None   WEIGHT BEARING RESTRICTIONS: No  PAIN:  Are you having pain? 0/10  FALLS: Has patient fallen in last 6 months? Yes. 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  HAND DOMINANCE: Right  ADLs: WFL  FUNCTIONAL OUTCOME MEASURES: Eval: Quick Dash: 84.1% disability with use of LUE 11/27/23 Quick Dash 6.8%   UPPER EXTREMITY ROM:     LUE AROM -  Lacks 35* elbow ext - 90*  flexion 80* shoulder flexion 60* wrist ext; 60* wrist flex  HAND FUNCTION: Grip strength: Right: 51.8 lbs; Left: 4.1 lbs 10/23/2023: Left: 9.7 lbs  11/27/23 right 45.1, 54.2, 46.2 Left 48.9, 39.6, 41.8 Average 43.4 lbs  COORDINATION: 9 Hole Peg test: Right: 20 sec; Left: 27 sec 11/27/23 Right 18.95 sec 17.5 sec Left Trial 1 - 27 sec Trial 2 - 17.86 sec   SENSATION: Paresthesias reported along radial distribution of L hand Resolved  EDEMA: mild reported and observed  COGNITION: Overall cognitive status: Within functional limits for tasks assessed  OBSERVATIONS: Pt appears well-kept. Guarding tendencies with LUE. Ambulates without AD. No LOB.   TODAY'S TREATMENT:            TherEx for ROM, endurance, and strength Objective measures assessed as noted in Goals section to determine progression towards goals and level of difficulty for subsequent exercise activitites. Excellent progress with grip strength, coordination and QuickDash score. (See results in objective data above and goal section below). Pt is cleared for light strengthening activities:  Pt completed arm bike in sitting for 6 minutes with average RPM of 30-45 for endurance, ROM, and strengthening of affected extremity using reciprocal arm movements. Pt alternating direction of pedaling halfway through. No cues provided to maintain stability with respect to anterior/posterior trunk lean and consistent grasp maintenance but education provided on how to make adjustments for use in the gym pt attends ([employer]).  Introduced theraband exercises with red band.  Practiced exercises in sitting and standing at a closed door with band positioned over the top of the door to work on shoulder and elbow ROM.  Reviewed motions including those provided in pt instructions: - Seated Elbow Flexion with Self-Anchored Resistance   - Seated Elbow Extension with Self-Anchored Resistance   - Seated Bilateral Shoulder External Rotation with  Resistance   - Seated Shoulder Flexion with Self-Anchored Resistance   - Seated Shoulder Diagonal Pulls with Resistance    In standing, various modifications made by changing positions ie) turning body 90 degrees x 4 positions with band over the top of the door ie) pull down, PNF like patterns L to R and R to L as well as pull out motions with back facing the door.   - Standing Single Arm Shoulder PNF D1 Extension with Anchored Resistance   - Standing Shoulder Single Arm PNF D2 Extension with Anchored Resistance   - Single Arm Shoulder Extension with Anchored Resistance     Self Care: Joint protection education provided re: positioning of UBE for use at gym, avoiding weight bearing at this time, modification of exercises, avoiding or stopping activities that increase discomfort.  PATIENT EDUCATION: Education details: Theraband exercises Person educated: Patient Education method: Explanation, Demonstration, Verbal cues, and Handouts Education comprehension: verbalized understanding, returned demonstration, and needs further education  HOME EXERCISE PROGRAM: 10/18/2023: LUE ROM; edema reduction Access Code: [email or web address] 10/23/2023: red putty HEP 11/01/23 - elbow ext at wall AAROM (see pt instructions) 11/27/23 - Theraband Exercises: Access Code: [email or web address]  GOALS:  SHORT TERM GOALS: Target date: 11/15/2023  Patient will demonstrate initial L UE HEP with 25% verbal cues or less for proper execution. Baseline: Goal status: MET  2.  Pt will verbalize understanding of edema reduction techniques as needed to improve pain and ROM with LUE.  Baseline:  Goal status: MET  LONG TERM GOALS: Target date: 12/21/2023  Patient will demonstrate updated L UE HEP with visual handouts only for proper execution. Baseline:  Goal status: IN Progress 11/27/23: Just beginning strengthening  2.  Patient will demonstrate at least 16% improvement with quick Dash score (reporting 68.1% disability or less) indicating  improved functional use of affected extremity. Baseline: 84.1% disability with use of LUE Goal status: MET  3.  Patient will demonstrate at least 30 lbs L grip strength as needed to open jars and other containers. Baseline: Left: 4.1 lbs 10/23/2023: Left: 9.7 lbs 11/01/23 - LUE: 31.7, 23.5, 34.1 (29.7 lbs average) with shaking noted, pt reported slight soreness.  11/27/23 - Average 43.4 lbs - no shaking or soreness Goal status: MET  4.  Patient will demo improved FM coordination as evidenced by completing nine-hole peg with use of L in 22 seconds or less. Baseline: Left: 27 sec 11/14/2023: 24 sec 11/27/23: R = L - 17 seconds Goal status: MET  ASSESSMENT:  CLINICAL IMPRESSION: Pt has made excellent progress towards goals having met 5/6 goals with just beginning strengthening activities today. Will review comfort with light strengthening at next visit and assess for DC as appropriate.   PERFORMANCE DEFICITS: in functional skills including ADLs, IADLs, coordination, sensation, edema, ROM, strength, pain, Fine motor control, Gross motor control, endurance, and UE functional use.   IMPAIRMENTS: are limiting patient from ADLs, IADLs, rest and sleep, work, leisure, and social participation.   COMORBIDITIES: may have co-morbidities  that affects occupational performance. Patient will benefit from skilled OT to address above impairments and improve overall function.  REHAB POTENTIAL: Good  PLAN:  OT FREQUENCY: 2x/week  OT DURATION: 8 weeks  PLANNED INTERVENTIONS: 97168 OT Re-evaluation, 97535 self care/ADL training, 96045 therapeutic exercise, 97530 therapeutic activity, 97112 neuromuscular re-education, 97140 manual therapy, 97035 ultrasound, 97018 paraffin, 40981 fluidotherapy, 97010 moist heat, 97034 contrast bath, 97760 Orthotic Initial, 97763 Orthotic/Prosthetic subsequent, passive range of motion, patient/family education, and DME and/or AE instructions  RECOMMENDED OTHER SERVICES: N/A  for this visit  CONSULTED AND AGREED WITH PLAN OF CARE: Patient  PLAN FOR NEXT SESSION: Check light strengthening (see ortho follow-up to update precautions); high level ROM/coordination Measure elbow ROM  Zora Hires, OT 11/27/2023, 6:23 PM

## 2023-11-29 ENCOUNTER — Ambulatory Visit: Admitting: Occupational Therapy

## 2023-11-29 DIAGNOSIS — S52125A Nondisplaced fracture of head of left radius, initial encounter for closed fracture: Secondary | ICD-10-CM | POA: Diagnosis not present

## 2023-11-29 DIAGNOSIS — R29818 Other symptoms and signs involving the nervous system: Secondary | ICD-10-CM

## 2023-11-29 DIAGNOSIS — M25622 Stiffness of left elbow, not elsewhere classified: Secondary | ICD-10-CM

## 2023-11-29 DIAGNOSIS — M6281 Muscle weakness (generalized): Secondary | ICD-10-CM

## 2023-11-29 DIAGNOSIS — R29898 Other symptoms and signs involving the musculoskeletal system: Secondary | ICD-10-CM

## 2023-11-29 DIAGNOSIS — R278 Other lack of coordination: Secondary | ICD-10-CM

## 2023-11-29 NOTE — Therapy (Signed)
 OUTPATIENT OCCUPATIONAL THERAPY ORTHO TREATMENT  Patient Name: Nicole [name] MRN: [id number] DOB:2000-12-04, 23 y.o., female Today's Date: 11/29/2023  PCP: Sharma Dears, NP  REFERRING PROVIDER: Wes Hamman, MD  END OF SESSION:  OT End of Session - 11/29/23 1535     Visit Number 7    Number of Visits 17    Date for OT Re-Evaluation 02/08/24    Authorization Type [employer]    Authorization Time Period VL: [place] Auth Not Reqd    OT Start Time 1534    OT Stop Time 1612    OT Time Calculation (min) 38 min    Equipment Utilized During Treatment --    Activity Tolerance Patient tolerated treatment well    Behavior During Therapy WFL for tasks assessed/performed         Past Medical History:  Diagnosis Date   Dysmenorrhea    Hirsutism 12/15/2012   Normal labs, PCOS unlikely, monitor symptoms over time.  OCPs started for menorrhagia and dysmenorrhea.    Seasonal allergies    Past Surgical History:  Procedure Laterality Date   WISDOM TOOTH EXTRACTION  10/2020   Patient Active Problem List   Diagnosis Date Noted   HPV (human papilloma virus) anogenital infection 11/18/2021   Abnormal lung sounds 06/14/2021   Dysphonia 12/10/2017   Globus sensation 12/10/2017   Adjustment disorder with mixed anxiety and depressed mood 05/15/2017   Voice strain 02/19/2017   Acne vulgaris 09/07/2014   Allergic rhinitis 01/31/2013   ONSET DATE: 10/16/2023 (Date of referral); 10/10/23 - date of injury  REFERRING DIAG: M25.522 (ICD-10-CM) - Pain in left elbow  THERAPY DIAG:  Muscle weakness (generalized)  Other lack of coordination  Other symptoms and signs involving the musculoskeletal system  Stiffness of left elbow, not elsewhere classified  Closed nondisplaced fracture of head of left radius, initial encounter  Other symptoms and signs involving the nervous system  Rationale for Evaluation and Treatment: Rehabilitation  SUBJECTIVE:   SUBJECTIVE STATEMENT: Name  pronunciation: [name]  Pt reports she still feels like her LUE is doing well. Some twinges with force and impact today during therapy but overall continues to be manageable. She requests more resistive theraband.   Pt agreeable to reduction in frequency of OT visits.   Pt accompanied by: self  PERTINENT HISTORY: PMH - adjustment disorder with mixed anxiety and depressed mood and HPV  Assessment and Plan Nondisplaced left radial head fracture Emphasized movement to prevent stiffness and ensure recovery. - Discontinue splint; use sling as needed. - Encourage elbow and finger movement within discomfort limits. - Refer to occupational therapy for movement assistance. - Follow-up in two weeks to reassess  PRECAUTIONS: Other: ROM as tolerated; no lifting >10 lbs  RED FLAGS: None   WEIGHT BEARING RESTRICTIONS: No  PAIN:  Are you having pain? 0/10  FALLS: Has patient fallen in last 6 months? Yes. 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  HAND DOMINANCE: Right  ADLs: WFL  FUNCTIONAL OUTCOME MEASURES: Eval: Quick Dash: 84.1% disability with use of LUE  11/27/23  Quick Dash 6.8%   UPPER EXTREMITY ROM:     LUE AROM -  Lacks 35* elbow ext - 90* flexion 80* shoulder flexion 60* wrist ext; 60* wrist flex  HAND FUNCTION: Grip strength: Right: 51.8 lbs; Left: 4.1 lbs 10/23/2023: Left: 9.7 lbs  11/27/23 right 45.1, 54.2, 46.2 Left 48.9, 39.6, 41.8 Average 43.4 lbs  COORDINATION: 9 Hole Peg test: Right: 20 sec; Left: 27  sec 11/27/23 Right 18.95 sec 17.5 sec Left Trial 1 - 27 sec Trial 2 - 17.86 sec   SENSATION: Paresthesias reported along radial distribution of L hand Resolved  EDEMA: mild reported and observed  COGNITION: Overall cognitive status: Within functional limits for tasks assessed  OBSERVATIONS: Pt appears well-kept. Guarding tendencies with LUE. Ambulates without AD. No LOB.   TODAY'S TREATMENT:            TherEx for ROM, endurance, and strength Reviewed theraband exercises with blue (6 lb resistance) band.  Good recall and tolerance with completion for strengthening.   With use of Left, pt completed toss and catch of 1 and 2 lb weighted ball at rebounder for proprioceptive input as needed to tolerate impact and force through this extremity.   With use of Bilateral, pt completed toss and catch of 4 lb weighted ball at rebounder for proprioceptive input as needed to tolerate impact and force through affected extremity.   OT had pt complete wall push-up with cues for positioning and monitoring of symptoms.   Pt removed and placed Squigz, suction toys of various sizes and shapes, using affected LUE from wall mirror for improved strength, ROM, and overall functional use.   Self Care:  OT provided additional education with respect to precautions (no lifting greater than 10 lbs) and monitoring pain/tolerance to activities. OT and pt agreed to reduction in therapy frequency with extended duration given next ortho appt. Toward the end of July.   OT educated pt on the use of vibration over L elbow to further help with tolerance to force and impact, management of pain, and to promote good blood flow.   PATIENT EDUCATION: Education details: Theraband exercises; strengthening; POC; LUE management Person educated: Patient Education method: Explanation, Demonstration, and Verbal cues Education comprehension: verbalized understanding, returned demonstration, and needs further education  HOME  EXERCISE PROGRAM: 10/18/2023: LUE ROM; edema reduction Access Code: [email or web address] 10/23/2023: red putty HEP 11/01/23 - elbow ext at wall AAROM (see pt instructions) 11/27/23 - Theraband Exercises: Access Code: [email or web address]  GOALS:  SHORT TERM GOALS: MET - 2/2  LONG TERM GOALS: Target date: 02/08/2024  Patient will demonstrate updated L UE HEP with visual handouts only for proper execution. Baseline:  Goal status: IN Progress 11/27/23: Just beginning strengthening  2.  Patient will demonstrate at least 16% improvement with quick Dash score (reporting 68.1% disability or less) indicating improved functional use of affected extremity. Baseline: 84.1% disability with use of LUE Goal status: MET  3.  Patient will demonstrate at least 30 lbs L grip strength as needed to open jars and other containers. Baseline: Left: 4.1 lbs 10/23/2023: Left: 9.7 lbs 11/01/23 - LUE: 31.7, 23.5, 34.1 (29.7 lbs average) with shaking noted, pt reported slight soreness.  11/27/23 - Average 43.4 lbs - no shaking or soreness Goal status: MET  4.  Patient will demo improved FM coordination as evidenced by completing nine-hole peg with use of L in 22 seconds or less. Baseline: Left: 27 sec 11/14/2023: 24 sec 11/27/23: R = L - 17 seconds Goal status: MET  5.  Pt will report no difficulty with return to prior gym exercises once medically appropriate.  Baseline: unable given precautions Goal status: INITIAL  ASSESSMENT:  CLINICAL IMPRESSION: Pt demonstrating good tolerance to therapy this visit with additional challenges to force and impact as well as strengthening of LUE within current precautions. She has made excellent progress towards goals and her frequency has been reduced as a result. Extending her duration this visit to allow follow-up after next ortho visit as pt still has lifting restrictions.   PERFORMANCE DEFICITS: in functional skills including ADLs, IADLs, coordination, sensation, edema, ROM, strength, pain, Fine  motor control, Gross motor control, endurance, and UE functional use.   IMPAIRMENTS: are limiting patient from ADLs, IADLs, rest and sleep, work, leisure, and social participation.   COMORBIDITIES: may have co-morbidities  that affects occupational performance. Patient will benefit from skilled OT to address above impairments and improve overall function.  REHAB POTENTIAL: Good  PLAN:  OT FREQUENCY: UPDATED: 1x/week to once every other week   OT DURATION: up to 6 additional sessions  PLANNED INTERVENTIONS: 97168 OT Re-evaluation, 97535 self care/ADL training, 16109 therapeutic exercise, 97530 therapeutic activity, 97112 neuromuscular re-education, 97140 manual therapy, 97035 ultrasound, 97018 paraffin, 60454 fluidotherapy, 97010 moist heat, 97034 contrast bath, 97760 Orthotic Initial, 97763 Orthotic/Prosthetic subsequent, passive range of motion, patient/family education, and DME and/or AE instructions  RECOMMENDED OTHER SERVICES: N/A for this visit  CONSULTED AND AGREED WITH PLAN OF CARE: Patient  PLAN FOR NEXT SESSION: monitor progress with strengthening; endurance; force and impact; isometrics? (With towel barrier)  Altamease Asters, OT 11/29/2023, 6:21 PM

## 2023-12-04 ENCOUNTER — Encounter: Admitting: Occupational Therapy

## 2023-12-05 ENCOUNTER — Ambulatory Visit: Admitting: Occupational Therapy

## 2023-12-05 DIAGNOSIS — M6281 Muscle weakness (generalized): Secondary | ICD-10-CM

## 2023-12-05 DIAGNOSIS — S52125A Nondisplaced fracture of head of left radius, initial encounter for closed fracture: Secondary | ICD-10-CM | POA: Diagnosis not present

## 2023-12-05 DIAGNOSIS — M25622 Stiffness of left elbow, not elsewhere classified: Secondary | ICD-10-CM

## 2023-12-05 DIAGNOSIS — R278 Other lack of coordination: Secondary | ICD-10-CM

## 2023-12-05 DIAGNOSIS — R29818 Other symptoms and signs involving the nervous system: Secondary | ICD-10-CM

## 2023-12-05 DIAGNOSIS — R29898 Other symptoms and signs involving the musculoskeletal system: Secondary | ICD-10-CM

## 2023-12-05 NOTE — Patient Instructions (Signed)
 Eccentric Forearm Strengthening  Place your affected arm on a tabletop palm down, elbow completely straight, and hand extended over the edge.  Lift the wrist up into extension with the fingers straight.  SLOWLY lower the hand towards the floor over the course of 20 seconds.  Use a timer to ensure the movement is performed this slowly.  Lift the wrist back up to the starting position and repeat.  Do 3 sets of 10 repetitions with a 1 minute rest between each set.

## 2023-12-05 NOTE — Therapy (Signed)
 OUTPATIENT OCCUPATIONAL THERAPY ORTHO TREATMENT  Patient Name: Nicole [name] MRN: [id number] DOB:August 02, 2000, 23 y.o., female Today's Date: 12/05/2023  PCP: Ileen Rosaline NOVAK, NP  REFERRING PROVIDER: Jerri Kay CHRISTELLA, MD  END OF SESSION:  OT End of Session - 12/05/23 1544     Visit Number 8    Number of Visits 17    Date for OT Re-Evaluation 02/08/24    Authorization Type [employer]    Authorization Time Period VL: [place] Auth Not Reqd    OT Start Time 1544    OT Stop Time 1622    OT Time Calculation (min) 38 min    Activity Tolerance Patient tolerated treatment well    Behavior During Therapy WFL for tasks assessed/performed         Past Medical History:  Diagnosis Date   Dysmenorrhea    Hirsutism 12/15/2012   Normal labs, PCOS unlikely, monitor symptoms over time.  OCPs started for menorrhagia and dysmenorrhea.    Seasonal allergies    Past Surgical History:  Procedure Laterality Date   WISDOM TOOTH EXTRACTION  10/2020   Patient Active Problem List   Diagnosis Date Noted   HPV (human papilloma virus) anogenital infection 11/18/2021   Abnormal lung sounds 06/14/2021   Dysphonia 12/10/2017   Globus sensation 12/10/2017   Adjustment disorder with mixed anxiety and depressed mood 05/15/2017   Voice strain 02/19/2017   Acne vulgaris 09/07/2014   Allergic rhinitis 01/31/2013   ONSET DATE: 10/16/2023 (Date of referral); 10/10/23 - date of injury  REFERRING DIAG: M25.522 (ICD-10-CM) - Pain in left elbow  THERAPY DIAG:  No diagnosis found.  Rationale for Evaluation and Treatment: Rehabilitation  SUBJECTIVE:   SUBJECTIVE STATEMENT: Name pronunciation: [name]  Pt reports she was really sore following arm workout at the gym. Reports lat pull downs with 45 lbs as she wasn't primarily using biceps/triceps.   Pt accompanied by: self  PERTINENT HISTORY: PMH - adjustment disorder with mixed anxiety and depressed mood and HPV  Assessment and Plan Nondisplaced left  radial head fracture Emphasized movement to prevent stiffness and ensure recovery. - Discontinue splint; use sling as needed. - Encourage elbow and finger movement within discomfort limits. - Refer to occupational therapy for movement assistance. - Follow-up in two weeks to reassess  PRECAUTIONS: Other: ROM as tolerated; no lifting >10 lbs  RED FLAGS: None   WEIGHT BEARING RESTRICTIONS: No  PAIN:  Are you having pain? 0/10  FALLS: Has patient fallen in last 6 months? Yes. 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  HAND DOMINANCE: Right  ADLs: WFL  FUNCTIONAL OUTCOME MEASURES: Eval: Quick Dash: 84.1% disability with use of LUE  11/27/23 Quick Dash 6.8%   UPPER EXTREMITY ROM:     LUE AROM -  Lacks 35* elbow ext - 90* flexion 80* shoulder flexion 60* wrist ext; 60* wrist flex  HAND FUNCTION: Grip strength: Right: 51.8 lbs; Left: 4.1 lbs 10/23/2023: Left: 9.7 lbs  11/27/23 right 45.1, 54.2, 46.2 Left 48.9, 39.6, 41.8 Average 43.4 lbs  COORDINATION: 9 Hole Peg test: Right:  20 sec; Left: 27 sec 11/27/23 Right 18.95 sec 17.5 sec Left Trial 1 - 27 sec Trial 2 - 17.86 sec   SENSATION: Paresthesias reported along radial distribution of L hand Resolved  EDEMA: mild reported and observed  COGNITION: Overall cognitive status: Within functional limits for tasks assessed  OBSERVATIONS: Pt appears well-kept. Guarding tendencies with LUE. Ambulates without AD. No LOB.   TODAY'S TREATMENT:            TherEx for ROM, endurance,  and strength Wrist flex and ext with red (20 lbs) flex bar x 2 min for strength and endurance of affected extremity  Supination with red (20 lbs) flex bar x 2 min for strength and endurance of affected extremity  Pronation with red (20 lbs) flex bar x 2 min for strength and endurance of affected extremity  OT initiated L forearm eccentric strengthening exercise as noted in pt instructions. Pt completed 1 set of 10.   OT instructed pt on isometric elbow flex and ext strengthening with use of towel barrier at tabletop. Pt held position for 5 seconds x 10 reps each.   - Self-care/home management completed for duration as noted below including: OT reviewed LUE precautions especially with respect to gym exercises/machinery. Pt educated that using LUE for >10 lbs of distributed weight can create significant stress to newly healed fracture and could result in injury. Pt cautioned to slow down per precautions but focusing on building endurance to light force and impact as well as using less traumatic versions of strengthening such as isometrics and eccentric exercises.   PATIENT EDUCATION: Education details: eccentric and isometric forearm exercises; gradual strengthening; LUE management Person educated: Patient Education method: Explanation, Demonstration, and Verbal cues Education comprehension: verbalized understanding, returned demonstration, and needs further education  HOME EXERCISE PROGRAM: 10/18/2023: LUE ROM; edema reduction Access Code: [email or web address] 10/23/2023: red putty HEP 11/01/23 - elbow ext at wall AAROM (see pt instructions) 11/27/23 - Theraband Exercises: Access Code: [email or web address] 12/05/2023: eccentric forearm HEP  GOALS:  SHORT TERM GOALS: MET - 2/2  LONG TERM GOALS: Target date: 02/08/2024  Patient will demonstrate updated L UE HEP with visual handouts only for proper execution. Baseline:  Goal status: IN Progress 11/27/23: Just beginning strengthening  2.  Patient will demonstrate  at least 16% improvement with quick Dash score (reporting 68.1% disability or less) indicating improved functional use of affected extremity. Baseline: 84.1% disability with use of LUE Goal status: MET  3.  Patient will demonstrate at least 30 lbs L grip strength as needed to open jars and other containers. Baseline: Left: 4.1 lbs 10/23/2023: Left: 9.7 lbs 11/01/23 - LUE: 31.7, 23.5, 34.1 (29.7 lbs average) with shaking noted, pt reported slight soreness.  11/27/23 - Average 43.4 lbs - no shaking or soreness Goal status: MET  4.  Patient will demo improved FM coordination as evidenced by completing nine-hole peg with use of L in 22 seconds or less. Baseline: Left: 27 sec 11/14/2023: 24 sec 11/27/23: R = L - 17 seconds Goal status: MET  5.  Pt will report no difficulty with return to prior gym exercises once medically appropriate.  Baseline: unable given precautions Goal status: INITIAL  ASSESSMENT:  CLINICAL IMPRESSION: Pt requiring education with respect to adhering to precautions with LUE to prevent injury. Pt responsive and was provided with alternative options to allow pt to work out but within precautions.   PERFORMANCE DEFICITS: in functional skills including ADLs, IADLs, coordination, sensation, edema, ROM, strength, pain, Fine motor control, Gross  motor control, endurance, and UE functional use.   IMPAIRMENTS: are limiting patient from ADLs, IADLs, rest and sleep, work, leisure, and social participation.   COMORBIDITIES: may have co-morbidities  that affects occupational performance. Patient will benefit from skilled OT to address above impairments and improve overall function.  REHAB POTENTIAL: Good  PLAN:  OT FREQUENCY: UPDATED: 1x/week to once every other week   OT DURATION: up to 6 additional sessions  PLANNED INTERVENTIONS: 97168 OT Re-evaluation, 97535 self care/ADL training, 02889 therapeutic exercise, 97530 therapeutic activity, 97112 neuromuscular re-education,  97140 manual therapy, 97035 ultrasound, 97018 paraffin, 02960 fluidotherapy, 97010 moist heat, 97034 contrast bath, 97760 Orthotic Initial, 97763 Orthotic/Prosthetic subsequent, passive range of motion, patient/family education, and DME and/or AE instructions  RECOMMENDED OTHER SERVICES: N/A for this visit  CONSULTED AND AGREED WITH PLAN OF CARE: Patient  PLAN FOR NEXT SESSION: monitor progress with strengthening; endurance; force and impact; isometrics? (With towel barrier)  Jocelyn CHRISTELLA Bottom, OT 12/05/2023, 3:48 PM

## 2023-12-06 ENCOUNTER — Encounter: Admitting: Occupational Therapy

## 2023-12-11 ENCOUNTER — Encounter: Admitting: Occupational Therapy

## 2023-12-13 ENCOUNTER — Encounter: Admitting: Occupational Therapy

## 2023-12-18 ENCOUNTER — Encounter: Admitting: Occupational Therapy

## 2023-12-20 ENCOUNTER — Ambulatory Visit: Attending: Orthopaedic Surgery | Admitting: Occupational Therapy

## 2023-12-20 DIAGNOSIS — R29898 Other symptoms and signs involving the musculoskeletal system: Secondary | ICD-10-CM | POA: Insufficient documentation

## 2023-12-20 DIAGNOSIS — M25622 Stiffness of left elbow, not elsewhere classified: Secondary | ICD-10-CM | POA: Insufficient documentation

## 2023-12-20 DIAGNOSIS — R278 Other lack of coordination: Secondary | ICD-10-CM | POA: Insufficient documentation

## 2023-12-20 DIAGNOSIS — R29818 Other symptoms and signs involving the nervous system: Secondary | ICD-10-CM | POA: Diagnosis present

## 2023-12-20 DIAGNOSIS — M6281 Muscle weakness (generalized): Secondary | ICD-10-CM | POA: Insufficient documentation

## 2023-12-20 DIAGNOSIS — S52125A Nondisplaced fracture of head of left radius, initial encounter for closed fracture: Secondary | ICD-10-CM | POA: Insufficient documentation

## 2023-12-20 NOTE — Therapy (Signed)
 OUTPATIENT OCCUPATIONAL THERAPY ORTHO TREATMENT  Patient Name: Nicole [name] MRN: [id number] DOB:2000/07/05, 23 y.o., female Today's Date: 12/20/2023  PCP: Ileen Rosaline NOVAK, NP  REFERRING PROVIDER: Jerri Kay CHRISTELLA, MD  END OF SESSION:  OT End of Session - 12/20/23 1622     Visit Number 9    Number of Visits 17    Date for OT Re-Evaluation 02/08/24    Authorization Type [employer]et[employer]    Authorization Time Period VL: [place] Auth Not Reqd    OT Start Time 1621    OT Stop Time 1700    OT Time Calculation (min) 39 min    Activity Tolerance Patient tolerated treatment well    Behavior During Therapy WFL for tasks assessed/performed         Past Medical History:  Diagnosis Date   Dysmenorrhea    Hirsutism 12/15/2012   Normal labs, PCOS unlikely, monitor symptoms over time.  OCPs started for menorrhagia and dysmenorrhea.    Seasonal allergies    Past Surgical History:  Procedure Laterality Date   WISDOM TOOTH EXTRACTION  10/2020   Patient Active Problem List   Diagnosis Date Noted   HPV (human papilloma virus) anogenital infection 11/18/2021   Abnormal lung sounds 06/14/2021   Dysphonia 12/10/2017   Globus sensation 12/10/2017   Adjustment disorder with mixed anxiety and depressed mood 05/15/2017   Voice strain 02/19/2017   Acne vulgaris 09/07/2014   Allergic rhinitis 01/31/2013   ONSET DATE: 10/16/2023 (Date of referral); 10/10/23 - date of injury  REFERRING DIAG: M25.522 (ICD-10-CM) - Pain in left elbow  THERAPY DIAG:  Muscle weakness (generalized)  Other lack of coordination  Other symptoms and signs involving the musculoskeletal system  Other symptoms and signs involving the nervous system  Closed nondisplaced fracture of head of left radius, initial encounter  Stiffness of left elbow, not elsewhere classified  Rationale for Evaluation and Treatment: Rehabilitation  SUBJECTIVE:   SUBJECTIVE STATEMENT: Name pronunciation: [name]  Pt reports she has not  been doing anything with her LUE out of caution.   Pt accompanied by: self  PERTINENT HISTORY: PMH - adjustment disorder with mixed anxiety and depressed mood and HPV  Assessment and Plan Nondisplaced left radial head fracture Emphasized movement to prevent stiffness and ensure recovery. - Discontinue splint; use sling as needed. - Encourage elbow and finger movement within discomfort limits. - Refer to occupational therapy for movement assistance. - Follow-up in two weeks to reassess  PRECAUTIONS: Other: ROM as tolerated; no lifting >10 lbs  RED FLAGS: None   WEIGHT BEARING RESTRICTIONS: No  PAIN:  Are you having pain? 0/10  FALLS: Has patient fallen in last 6 months? Yes. 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  HAND DOMINANCE: Right  ADLs: WFL  FUNCTIONAL OUTCOME MEASURES: Eval: Quick Dash: 84.1% disability with use of LUE  11/27/23 Quick Dash 6.8%   UPPER EXTREMITY ROM:     LUE AROM -  Lacks 35* elbow ext - 90* flexion 80* shoulder flexion 60* wrist ext; 60* wrist flex  HAND FUNCTION: Grip  strength: Right: 51.8 lbs; Left: 4.1 lbs 10/23/2023: Left: 9.7 lbs  11/27/23 right 45.1, 54.2, 46.2 Left 48.9, 39.6, 41.8 Average 43.4 lbs  COORDINATION: 9 Hole Peg test: Right: 20 sec; Left: 27 sec 11/27/23 Right 18.95 sec 17.5 sec Left Trial 1 - 27 sec Trial 2 - 17.86 sec   SENSATION: Paresthesias reported along radial distribution of L hand Resolved  EDEMA: mild reported and observed  COGNITION: Overall cognitive  status: Within functional limits for tasks assessed  OBSERVATIONS: Pt appears well-kept. Guarding tendencies with LUE. Ambulates without AD. No LOB.   TODAY'S TREATMENT:            TherEx for ROM, endurance, and strength With use of LUE, patient rolled ball at wall at shoulder height to write ABC for ROM and endurance.   With use of Left, pt completed toss and catch of 2 lb weighted ball at rebounder for proprioceptive input as needed to tolerate impact and force through this extremity.   With use of Bilateral, pt completed toss and catch of 4 lb weighted ball at rebounder for proprioceptive input as needed to tolerate impact and force through this extremity.   - Self-care/home management completed for duration as noted below including: OT reviewed LUE HEPs directing her to MyChart and [facility] (pt also downloading app to phone) for a list of things she can and should be doing.  Education provided with respect to ortho follow-up and therapy visit later that day.   PATIENT EDUCATION: Education details: HEP; LUE management Person educated: Patient Education method: Explanation, Demonstration, and Verbal cues Education comprehension: verbalized understanding, returned demonstration, and needs further education  HOME EXERCISE PROGRAM: 10/18/2023: LUE ROM; edema reduction Access Code: [email or web address] 10/23/2023: red putty HEP 11/01/23 - elbow ext at wall AAROM (see pt instructions) 11/27/23 - Theraband Exercises: Access Code: [email or web address] 12/05/2023: eccentric forearm HEP 12/20/2023: bicep and tricep isometrics Access Code: [email or web address]  GOALS:  SHORT TERM GOALS: MET - 2/2  LONG TERM GOALS: Target date: 02/08/2024  Patient will demonstrate updated L UE HEP with visual handouts only for proper execution. Baseline:  Goal status: IN Progress 11/27/23: Just beginning strengthening  2.  Patient will demonstrate at least 16% improvement with quick Dash score (reporting 68.1% disability or less) indicating  improved functional use of affected extremity. Baseline: 84.1% disability with use of LUE Goal status: MET  3.  Patient will demonstrate at least 30 lbs L grip strength as needed to open jars and other containers. Baseline: Left: 4.1 lbs 10/23/2023: Left: 9.7 lbs 11/01/23 - LUE: 31.7, 23.5, 34.1 (29.7 lbs average) with shaking noted, pt reported slight soreness.  11/27/23 - Average 43.4 lbs - no shaking or soreness Goal status: MET  4.  Patient will demo improved FM coordination as evidenced by completing nine-hole peg with use of L in 22 seconds or less. Baseline: Left: 27 sec 11/14/2023: 24 sec 11/27/23: R = L - 17 seconds Goal status: MET  5.  Pt will report no difficulty with return to prior gym exercises once medically appropriate.  Baseline: unable given precautions Goal status: INITIAL  ASSESSMENT:  CLINICAL IMPRESSION: Pt requiring repeat education with respect to adhering to precautions with LUE to prevent injury while also completing desired HEP as needed to progress towards goals. Anticipate pt to be released from ortho prior to next visit with potential for OT d/c as long as no further limitations noted. PERFORMANCE DEFICITS: in functional skills including ADLs, IADLs, coordination, sensation, edema, ROM, strength, pain, Fine motor control,  Gross motor control, endurance, and UE functional use.   IMPAIRMENTS: are limiting patient from ADLs, IADLs, rest and sleep, work, leisure, and social participation.   COMORBIDITIES: may have co-morbidities  that affects occupational performance. Patient will benefit from skilled OT to address above impairments and improve overall function.  REHAB POTENTIAL: Good  PLAN:  OT FREQUENCY: UPDATED: 1x/week to once every other week   OT DURATION: up to 6 additional sessions  PLANNED INTERVENTIONS: 97168 OT Re-evaluation, 97535 self care/ADL training, 02889 therapeutic exercise, 97530 therapeutic activity, 97112 neuromuscular re-education,  97140 manual therapy, 97035 ultrasound, 97018 paraffin, 02960 fluidotherapy, 97010 moist heat, 97034 contrast bath, 97760 Orthotic Initial, 97763 Orthotic/Prosthetic subsequent, passive range of motion, patient/family education, and DME and/or AE instructions  RECOMMENDED OTHER SERVICES: N/A for this visit  CONSULTED AND AGREED WITH PLAN OF CARE: Patient  PLAN FOR NEXT SESSION: how did ortho follow-up go?; additional modifications; d/c?  Jocelyn CHRISTELLA Bottom, OT 12/20/2023, 4:25 PM

## 2024-01-01 ENCOUNTER — Ambulatory Visit: Admitting: Physician Assistant

## 2024-01-01 ENCOUNTER — Ambulatory Visit: Payer: Self-pay | Admitting: Occupational Therapy

## 2024-01-01 ENCOUNTER — Telehealth: Payer: Self-pay

## 2024-01-01 DIAGNOSIS — R278 Other lack of coordination: Secondary | ICD-10-CM

## 2024-01-01 DIAGNOSIS — R29818 Other symptoms and signs involving the nervous system: Secondary | ICD-10-CM

## 2024-01-01 DIAGNOSIS — M6281 Muscle weakness (generalized): Secondary | ICD-10-CM | POA: Diagnosis not present

## 2024-01-01 DIAGNOSIS — M25622 Stiffness of left elbow, not elsewhere classified: Secondary | ICD-10-CM

## 2024-01-01 DIAGNOSIS — S52125A Nondisplaced fracture of head of left radius, initial encounter for closed fracture: Secondary | ICD-10-CM

## 2024-01-01 DIAGNOSIS — R29898 Other symptoms and signs involving the musculoskeletal system: Secondary | ICD-10-CM

## 2024-01-01 NOTE — Therapy (Signed)
 OUTPATIENT OCCUPATIONAL THERAPY ORTHO TREATMENT AND DISCHARGE  Patient Name: Nicole [name] MRN: [id number] DOB:[birth year], 23 y.o., female Today's Date: 01/01/2024  OCCUPATIONAL THERAPY DISCHARGE SUMMARY  Visits from Start of Care: 10  Current functional level related to goals / functional outcomes: Patient has met all short and long-term goals to date.   Remaining deficits: No significant functional limitations    Education / Equipment: Continue modifications of exercises for gradual strengthening and as recommended by your physician.    Patient agrees to discharge. Patient goals were met. Patient is being discharged due to being pleased with the current functional level.SABRA  PCP: Ileen Rosaline NOVAK, NP  REFERRING PROVIDER: Jerri Kay CHRISTELLA, MD  END OF SESSION:  OT End of Session - 01/01/24 1653     Visit Number 10    Number of Visits 17    Date for OT Re-Evaluation 02/08/24    Authorization Type [employer]et[employer]    Authorization Time Period VL: [place] Auth Not Reqd    OT Start Time 1620    OT Stop Time 1658    OT Time Calculation (min) 38 min    Activity Tolerance Patient tolerated treatment well    Behavior During Therapy WFL for tasks assessed/performed         Past Medical History:  Diagnosis Date   Dysmenorrhea    Hirsutism 12/15/2012   Normal labs, PCOS unlikely, monitor symptoms over time.  OCPs started for menorrhagia and dysmenorrhea.    Seasonal allergies    Past Surgical History:  Procedure Laterality Date   WISDOM TOOTH EXTRACTION  10/2020   Patient Active Problem List   Diagnosis Date Noted   HPV (human papilloma virus) anogenital infection 11/18/2021   Abnormal lung sounds 06/14/2021   Dysphonia 12/10/2017   Globus sensation 12/10/2017   Adjustment disorder with mixed anxiety and depressed mood 05/15/2017   Voice strain 02/19/2017   Acne vulgaris 09/07/2014   Allergic rhinitis 01/31/2013   ONSET DATE: 10/16/2023 (Date of referral); 10/10/23 - date of  injury  REFERRING DIAG: M25.522 (ICD-10-CM) - Pain in left elbow  THERAPY DIAG:  Muscle weakness (generalized)  Other lack of coordination  Other symptoms and signs involving the musculoskeletal system  Other symptoms and signs involving the nervous system  Closed nondisplaced fracture of head of left radius, initial encounter  Stiffness of left elbow, not elsewhere classified  Rationale for Evaluation and Treatment: Rehabilitation  SUBJECTIVE:   SUBJECTIVE STATEMENT: Name pronunciation: [name]  Pt reports she did not get her x-ray today or see ortho for follow-up due to her copay.   Overall, she reports things are going well.   Pt accompanied by: self  PERTINENT HISTORY: PMH - adjustment disorder with mixed anxiety and depressed mood and HPV  Assessment and Plan Nondisplaced left radial head fracture Emphasized movement to prevent stiffness and ensure recovery. - Discontinue splint; use sling as needed. - Encourage elbow and finger movement within discomfort limits. - Refer to occupational therapy for movement assistance. - Follow-up in two weeks to reassess  PRECAUTIONS: Other: ROM as tolerated; no lifting >10 lbs  RED FLAGS: None   WEIGHT BEARING RESTRICTIONS: No  PAIN:  Are you having pain? 0/10  FALLS: Has patient fallen in last 6 months? Yes. 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  HAND DOMINANCE: Right  ADLs: WFL  FUNCTIONAL OUTCOME MEASURES: Eval: Quick Dash: 84.1% disability with use of  LUE  11/27/23 Quick Dash 6.8%   UPPER EXTREMITY ROM:     LUE AROM -  Lacks 35* elbow ext - 90* flexion 80* shoulder flexion 60* wrist ext; 60* wrist flex  HAND FUNCTION: Grip strength: Right: 51.8 lbs; Left: 4.1 lbs 10/23/2023: Left: 9.7 lbs  11/27/23 right 45.1, 54.2, 46.2 Left 48.9, 39.6, 41.8 Average 43.4 lbs  COORDINATION: 9 Hole Peg test: Right: 20 sec; Left: 27 sec 11/27/23 Right 18.95 sec 17.5 sec Left Trial 1 - 27 sec Trial 2 - 17.86 sec   SENSATION: Paresthesias reported along radial distribution of L hand Resolved  EDEMA: mild reported and observed  COGNITION: Overall cognitive status: Within functional limits for tasks assessed  OBSERVATIONS: Pt appears well-kept. Guarding tendencies with LUE. Ambulates without AD. No LOB.   TODAY'S TREATMENT:            Objective measures assessed as noted in Goals section to determine progression towards goals. Therapist reviewed goals with patient and updated patient progression.  No additional functional limitations identified.  PATIENT EDUCATION: Education details: OT D/C Person educated: Patient Education method: Programmer, multimedia, Demonstration, and Verbal cues Education comprehension: verbalized understanding and returned demonstration  HOME EXERCISE PROGRAM: 10/18/2023: LUE ROM; edema reduction Access Code: [email or web address] 10/23/2023: red putty HEP 11/01/23 - elbow ext at wall AAROM (see pt instructions) 11/27/23 - Theraband Exercises: Access Code: [email or web address] 12/05/2023: eccentric forearm HEP 12/20/2023: bicep and tricep isometrics Access Code: [email or web address]  GOALS:  SHORT TERM GOALS: MET - 2/2  LONG TERM GOALS: Target date: 02/08/2024  Patient will demonstrate updated L UE HEP with visual handouts only for proper execution. Baseline:  Goal status: MET 11/27/23: Just beginning strengthening  2.  Patient will demonstrate at least 16% improvement with quick Dash score (reporting 68.1% disability or less) indicating improved  functional use of affected extremity. Baseline: 84.1% disability with use of LUE Goal status: MET  3.  Patient will demonstrate at least 30 lbs L grip strength as needed to open jars and other containers. Baseline: Left: 4.1 lbs 10/23/2023: Left: 9.7 lbs 11/01/23 - LUE: 31.7, 23.5, 34.1 (29.7 lbs average) with shaking noted, pt reported slight soreness.  11/27/23 - Average 43.4 lbs - no shaking or soreness Goal status: MET  4.  Patient will demo improved FM coordination as evidenced by completing nine-hole peg with use of L in 22 seconds or less. Baseline: Left: 27 sec 11/14/2023: 24 sec 11/27/23: R = L - 17 seconds Goal status: MET  5.  Pt will report no difficulty with return to prior gym exercises once medically appropriate.  Baseline: unable given precautions Goal status: IN PROGRESS  ASSESSMENT:  CLINICAL IMPRESSION: Patient is appropriate for discharge and no longer demonstrates medical necessity for continued skilled occupational therapy services. Continue modifications of exercises for gradual strengthening and as recommended by your physician.   PLAN:  OT D/C Completed  CONSULTED AND AGREED WITH PLAN OF CARE: Patient  Jocelyn CHRISTELLA Bottom, OT 01/01/2024, 5:18 PM

## 2024-01-01 NOTE — Telephone Encounter (Signed)
 From Jocelyn Bottom, OT Hey! Nihira MRN [id number] is here with me now. She mentioned she was upset because she didn't realize she would have a $96 co-pay to get her x-ray today and was so upset she left the office.[email or web address] advisement? She isn't having any issues.    My response to [facility]- Unfortunately we cannot control what her insurance requires for a copay.  She was coming in for a reevaluation with Morna and also x-rays. We want to make sure her fracture is healing like it should.

## 2024-01-02 NOTE — Telephone Encounter (Signed)
 The only way to monitor fracture healing is with xray which we correlate with clinical exam.  I am sorry that her insurance does not cover more

## 2024-01-21 ENCOUNTER — Other Ambulatory Visit (HOSPITAL_COMMUNITY)
Admission: RE | Admit: 2024-01-21 | Discharge: 2024-01-21 | Disposition: A | Discharge status: Home / Self Care | Source: Ambulatory Visit | Attending: Obstetrics | Admitting: Obstetrics

## 2024-01-21 ENCOUNTER — Ambulatory Visit

## 2024-01-21 VITALS — BP 108/74 | HR 78 | Wt 166.2 lb

## 2024-01-21 DIAGNOSIS — A64 Unspecified sexually transmitted disease: Secondary | ICD-10-CM | POA: Insufficient documentation

## 2024-01-21 DIAGNOSIS — N760 Acute vaginitis: Secondary | ICD-10-CM | POA: Diagnosis not present

## 2024-01-21 LAB — POCT URINALYSIS DIPSTICK
Bilirubin, UA: NEGATIVE
Blood, UA: NEGATIVE
Glucose, UA: NEGATIVE
Ketones, UA: NEGATIVE
Leukocytes, UA: NEGATIVE
Nitrite, UA: NEGATIVE
Protein, UA: POSITIVE — AB
Spec Grav, UA: 1.015 (ref 1.010–1.025)
Urobilinogen, UA: 0.2 U/dL
pH, UA: 6 (ref 5.0–8.0)

## 2024-01-21 NOTE — Progress Notes (Signed)
.  SUBJECTIVE:  23 y.o. female complains of grey vaginal discharge for 1 week(s). Denies abnormal vaginal bleeding or significant pelvic pain or fever. No UTI symptoms. Denies history of known exposure to STD.  Patient's last menstrual period was 01/02/2024 (exact date).  OBJECTIVE:  She appears well, afebrile. Urine dipstick: positive for protein.  ASSESSMENT:  Vaginal Discharge  Vaginal Odor   PLAN:  GC, chlamydia, trichomonas, BVAG, CVAG probe sent to lab. Treatment: To be determined once lab results are received ROV prn if symptoms persist or worsen.   S/S: DISCHARGE AND FISHY SMELL

## 2024-01-22 ENCOUNTER — Ambulatory Visit: Payer: Self-pay | Admitting: Obstetrics and Gynecology

## 2024-01-22 LAB — CERVICOVAGINAL ANCILLARY ONLY
Bacterial Vaginitis (gardnerella): POSITIVE — AB
Candida Glabrata: NEGATIVE
Candida Vaginitis: NEGATIVE
Chlamydia: NEGATIVE
Comment: NEGATIVE
Comment: NEGATIVE
Comment: NEGATIVE
Comment: NEGATIVE
Comment: NEGATIVE
Comment: NORMAL
Neisseria Gonorrhea: NEGATIVE
Trichomonas: NEGATIVE

## 2024-01-23 ENCOUNTER — Other Ambulatory Visit: Payer: Self-pay

## 2024-01-23 MED ORDER — METRONIDAZOLE 500 MG PO TABS: 500.0000 mg | ORAL_TABLET | Freq: Two times a day (BID) | ORAL | 0 refills | Status: AC

## 2024-02-27 ENCOUNTER — Ambulatory Visit: Admitting: Obstetrics and Gynecology

## 2024-03-20 ENCOUNTER — Ambulatory Visit: Admitting: Physician Assistant

## 2024-04-08 ENCOUNTER — Ambulatory Visit: Admitting: Advanced Practice Midwife

## 2024-04-14 ENCOUNTER — Encounter: Payer: Self-pay | Admitting: Radiology

## 2024-06-09 ENCOUNTER — Encounter: Payer: Self-pay | Admitting: Obstetrics and Gynecology

## 2024-06-09 ENCOUNTER — Other Ambulatory Visit (HOSPITAL_COMMUNITY)
Admission: RE | Admit: 2024-06-09 | Discharge: 2024-06-09 | Disposition: A | Discharge status: Home / Self Care | Source: Ambulatory Visit | Attending: Obstetrics and Gynecology | Admitting: Obstetrics and Gynecology

## 2024-06-09 ENCOUNTER — Ambulatory Visit: Admitting: Obstetrics and Gynecology

## 2024-06-09 VITALS — BP 111/67 | HR 58 | Ht 62.0 in | Wt 155.2 lb

## 2024-06-09 DIAGNOSIS — Z113 Encounter for screening for infections with a predominantly sexual mode of transmission: Secondary | ICD-10-CM

## 2024-06-09 DIAGNOSIS — Z01419 Encounter for gynecological examination (general) (routine) without abnormal findings: Secondary | ICD-10-CM

## 2024-06-09 DIAGNOSIS — N898 Other specified noninflammatory disorders of vagina: Secondary | ICD-10-CM | POA: Diagnosis not present

## 2024-06-09 DIAGNOSIS — Z124 Encounter for screening for malignant neoplasm of cervix: Secondary | ICD-10-CM | POA: Insufficient documentation

## 2024-06-09 NOTE — Progress Notes (Unsigned)
 "  ANNUAL EXAM Patient name: Nicole [name] MRN [id number]  Date of birth: July 26, 2000 Chief Complaint:   No chief complaint on file.  History of Present Illness:   Nicole [name] is a 23 y.o. G0P0000  female being seen today for a routine annual exam.  Current complaints: reports she stopped nuvaring due to pelvic pain with intercourse and intermittently unrelated to intercourse. She is using condoms for birth control   Patient's last menstrual period was 06/05/2024.   The pregnancy intention screening data noted above was reviewed. Potential methods of contraception were discussed. The patient elected to proceed with condoms   Gynecologic History Patient's last menstrual period was . Contraception: condoms Last Pap: 05/25/21 Results were: NILM Last mammogram: n/a     06/09/2024    1:28 PM 05/27/2021    8:57 AM 08/26/2019   11:10 AM 10/15/2018   12:16 PM 07/31/2017    4:27 PM  Depression screen PHQ 2/9  Decreased Interest 0 0 1 2 0  Down, Depressed, Hopeless 0 0 1 1 1   PHQ - 2 Score 0 0 2 3 1   Altered sleeping 0 1 0 3 0  Tired, decreased energy 0 1 0 3 1  Change in appetite 0 0 0 0 0  Feeling bad or failure about yourself  0 0 1 1 0  Trouble concentrating 1 1 0 3 0  Moving slowly or fidgety/restless 0 0 0 0 0  Suicidal thoughts 0 0     PHQ-9 Score 1 3  3  13  2    Difficult doing work/chores  Not difficult at all        Data saved with a previous flowsheet row definition        06/09/2024    1:29 PM 05/27/2021    8:58 AM 08/26/2019   11:10 AM 10/15/2018   12:16 PM  GAD 7 : Generalized Anxiety Score  Nervous, Anxious, on Edge 3 1 1 2   Control/stop worrying 1 0 0 1  Worry too much - different things 1 0 1 3  Trouble relaxing 2 1 0 3  Restless 0 0 0 1  Easily annoyed or irritable 0 0 1 2  Afraid - awful might happen 0 0 0 1  Total GAD 7 Score 7 2 3 13   Anxiety Difficulty  Not difficult at all       Review of Systems:   Pertinent items are noted in  HPI Denies any headaches, blurred vision, fatigue, shortness of breath, chest pain, abdominal pain, abnormal vaginal discharge/itching/odor/irritation, problems with periods, bowel movements, urination, or intercourse unless otherwise stated above. Pertinent History Reviewed:  Reviewed past medical,surgical, social and family history.  Reviewed problem list, medications and allergies. Physical Assessment:   Vitals:   06/09/24 1321  BP: 111/67  Pulse: (!) 58  Weight: 155 lb 3.2 oz (70.4 kg)  Height: 5' 2 (1.575 m)  Body mass index is 28.39 kg/m.        Physical Examination:   General appearance - well appearing, and in no distress  Mental status - alert, oriented   Psych:  She has a normal mood and affect  Skin - warm and dry, normal color  Chest - effort normal  Neck:  midline trachea  Breasts - breasts appear normal, no suspicious masses, no skin or nipple changes or  axillary nodes  Abdomen - soft, nontender  Pelvic - VULVA: normal appearing vulva with no masses, tenderness or lesions  VAGINA: normal  appearing vagina with normal color and discharge, no lesions  CERVIX: normal appearing cervix without discharge or lesions, no CMT  Thin prep pap is done   Extremities:  No swelling or varicosities noted  Chaperone present for exam  No results found for this or any previous visit (from the past 24 hours).  Assessment & Plan:  1. Encounter for annual routine gynecological examination (Primary) Pap today Mammogram at 39, encouraged self breast awareness She has a PCP Discussed follow up if abdominal/pelvic pain returns or worsens   2. Cervical cancer screening  - Cytology - PAP( [place])  3. Screen for STD (sexually transmitted disease) Desires screening today  - Cervicovaginal ancillary only( [place]) - HIV antibody (with reflex) - RPR - Hepatitis B Surface AntiGEN - Hepatitis C Antibody   Labs/procedures today:   Mammogram: @ 24yo, or sooner if  problems Colonoscopy: @ 24yo, or sooner if problems  No orders of the defined types were placed in this encounter.   Meds: No orders of the defined types were placed in this encounter.   Follow-up: Return in about 1 year (around 06/09/2025) for Nicole [name] Nicole Delores, FNP  "

## 2024-06-09 NOTE — Progress Notes (Unsigned)
 Pt presents for AEX Last PAP 05-25-21 Requesting STD testing   Pt stopped Nuvaring due to pelvic pain. Using condoms

## 2024-06-10 LAB — CERVICOVAGINAL ANCILLARY ONLY
Chlamydia: NEGATIVE
Comment: NEGATIVE
Comment: NEGATIVE
Comment: NORMAL
Neisseria Gonorrhea: NEGATIVE
Trichomonas: NEGATIVE

## 2024-06-13 ENCOUNTER — Ambulatory Visit: Payer: Self-pay | Admitting: Obstetrics and Gynecology

## 2024-06-13 DIAGNOSIS — R87610 Atypical squamous cells of undetermined significance on cytologic smear of cervix (ASC-US): Secondary | ICD-10-CM

## 2024-06-17 DIAGNOSIS — R87610 Atypical squamous cells of undetermined significance on cytologic smear of cervix (ASC-US): Secondary | ICD-10-CM | POA: Insufficient documentation

## 2024-06-17 LAB — CYTOLOGY - PAP
Adequacy: ABSENT
Comment: NEGATIVE
Diagnosis: UNDETERMINED — AB
High risk HPV: POSITIVE — AB
